# Patient Record
Sex: Male | Born: 1955 | Race: White | Hispanic: No | Smoking: Current every day smoker
Health system: Southern US, Community
[De-identification: ages and names within clinical notes are randomized; demographics above are authoritative.]

## PROBLEM LIST (undated history)

## (undated) DIAGNOSIS — F209 Schizophrenia, unspecified: Secondary | ICD-10-CM

## (undated) DIAGNOSIS — M549 Dorsalgia, unspecified: Secondary | ICD-10-CM

## (undated) DIAGNOSIS — L409 Psoriasis, unspecified: Secondary | ICD-10-CM

## (undated) DIAGNOSIS — C6290 Malignant neoplasm of unspecified testis, unspecified whether descended or undescended: Secondary | ICD-10-CM

## (undated) DIAGNOSIS — F1911 Other psychoactive substance abuse, in remission: Secondary | ICD-10-CM

## (undated) DIAGNOSIS — I10 Essential (primary) hypertension: Secondary | ICD-10-CM

## (undated) HISTORY — DX: Schizophrenia, unspecified: F20.9

## (undated) HISTORY — PX: LUMBAR DISC SURGERY: SHX700

## (undated) HISTORY — DX: Other psychoactive substance abuse, in remission: F19.11

## (undated) HISTORY — DX: Malignant neoplasm of unspecified testis, unspecified whether descended or undescended: C62.90

---

## 2000-03-21 ENCOUNTER — Emergency Department (HOSPITAL_COMMUNITY): Admission: EM | Admit: 2000-03-21 | Discharge: 2000-03-21 | Payer: Self-pay | Admitting: Emergency Medicine

## 2002-08-24 ENCOUNTER — Emergency Department (HOSPITAL_COMMUNITY): Admission: EM | Admit: 2002-08-24 | Discharge: 2002-08-24 | Payer: Self-pay | Admitting: Emergency Medicine

## 2002-08-26 ENCOUNTER — Emergency Department (HOSPITAL_COMMUNITY): Admission: EM | Admit: 2002-08-26 | Discharge: 2002-08-26 | Payer: Self-pay | Admitting: Emergency Medicine

## 2002-08-30 ENCOUNTER — Emergency Department (HOSPITAL_COMMUNITY): Admission: EM | Admit: 2002-08-30 | Discharge: 2002-08-30 | Payer: Self-pay | Admitting: Emergency Medicine

## 2002-10-15 ENCOUNTER — Other Ambulatory Visit (HOSPITAL_COMMUNITY): Admission: RE | Admit: 2002-10-15 | Discharge: 2002-11-11 | Payer: Self-pay | Admitting: Psychiatry

## 2003-09-21 ENCOUNTER — Emergency Department (HOSPITAL_COMMUNITY): Admission: EM | Admit: 2003-09-21 | Discharge: 2003-09-21 | Payer: Self-pay | Admitting: Emergency Medicine

## 2003-12-24 ENCOUNTER — Emergency Department (HOSPITAL_COMMUNITY): Admission: EM | Admit: 2003-12-24 | Discharge: 2003-12-24 | Payer: Self-pay | Admitting: Emergency Medicine

## 2004-12-15 ENCOUNTER — Emergency Department (HOSPITAL_COMMUNITY): Admission: EM | Admit: 2004-12-15 | Discharge: 2004-12-16 | Payer: Self-pay | Admitting: Emergency Medicine

## 2004-12-16 ENCOUNTER — Inpatient Hospital Stay (HOSPITAL_COMMUNITY): Admission: EM | Admit: 2004-12-16 | Discharge: 2004-12-20 | Payer: Self-pay | Admitting: Psychiatry

## 2004-12-16 ENCOUNTER — Ambulatory Visit: Payer: Self-pay | Admitting: Psychiatry

## 2005-01-08 ENCOUNTER — Emergency Department (HOSPITAL_COMMUNITY): Admission: EM | Admit: 2005-01-08 | Discharge: 2005-01-08 | Payer: Self-pay | Admitting: Emergency Medicine

## 2005-01-08 ENCOUNTER — Inpatient Hospital Stay (HOSPITAL_COMMUNITY): Admission: RE | Admit: 2005-01-08 | Discharge: 2005-01-14 | Payer: Self-pay | Admitting: Psychiatry

## 2005-02-18 ENCOUNTER — Ambulatory Visit (HOSPITAL_COMMUNITY): Payer: Self-pay | Admitting: Psychiatry

## 2005-02-18 ENCOUNTER — Ambulatory Visit: Payer: Self-pay | Admitting: Psychiatry

## 2005-02-18 ENCOUNTER — Inpatient Hospital Stay (HOSPITAL_COMMUNITY): Admission: RE | Admit: 2005-02-18 | Discharge: 2005-02-24 | Payer: Self-pay | Admitting: Psychiatry

## 2005-02-25 ENCOUNTER — Ambulatory Visit (HOSPITAL_COMMUNITY): Payer: Self-pay | Admitting: Psychiatry

## 2005-03-13 ENCOUNTER — Ambulatory Visit (HOSPITAL_COMMUNITY): Payer: Self-pay | Admitting: Physician Assistant

## 2005-03-13 ENCOUNTER — Ambulatory Visit: Payer: Self-pay | Admitting: Physician Assistant

## 2005-04-22 ENCOUNTER — Ambulatory Visit (HOSPITAL_COMMUNITY): Payer: Self-pay | Admitting: Psychiatry

## 2005-05-20 ENCOUNTER — Ambulatory Visit (HOSPITAL_COMMUNITY): Payer: Self-pay | Admitting: Psychiatry

## 2005-06-19 ENCOUNTER — Ambulatory Visit (HOSPITAL_COMMUNITY): Payer: Self-pay | Admitting: Psychiatry

## 2005-07-22 ENCOUNTER — Ambulatory Visit (HOSPITAL_COMMUNITY): Payer: Self-pay | Admitting: Psychiatry

## 2005-09-26 ENCOUNTER — Ambulatory Visit (HOSPITAL_COMMUNITY): Payer: Self-pay | Admitting: Physician Assistant

## 2005-11-27 ENCOUNTER — Ambulatory Visit (HOSPITAL_COMMUNITY): Payer: Self-pay | Admitting: Psychiatry

## 2006-02-21 ENCOUNTER — Ambulatory Visit (HOSPITAL_COMMUNITY): Payer: Self-pay | Admitting: Psychiatry

## 2006-03-03 ENCOUNTER — Inpatient Hospital Stay (HOSPITAL_COMMUNITY): Admission: RE | Admit: 2006-03-03 | Discharge: 2006-03-07 | Payer: Self-pay | Admitting: *Deleted

## 2006-03-04 ENCOUNTER — Ambulatory Visit: Payer: Self-pay | Admitting: *Deleted

## 2006-05-28 ENCOUNTER — Ambulatory Visit (HOSPITAL_COMMUNITY): Payer: Self-pay | Admitting: Psychiatry

## 2006-10-27 ENCOUNTER — Inpatient Hospital Stay (HOSPITAL_COMMUNITY): Admission: EM | Admit: 2006-10-27 | Discharge: 2006-10-31 | Payer: Self-pay | Admitting: *Deleted

## 2006-10-27 ENCOUNTER — Ambulatory Visit: Payer: Self-pay | Admitting: *Deleted

## 2006-11-19 ENCOUNTER — Ambulatory Visit (HOSPITAL_COMMUNITY): Payer: Self-pay | Admitting: Psychiatry

## 2006-12-09 ENCOUNTER — Inpatient Hospital Stay (HOSPITAL_COMMUNITY): Admission: RE | Admit: 2006-12-09 | Discharge: 2006-12-15 | Payer: Self-pay | Admitting: *Deleted

## 2006-12-09 ENCOUNTER — Ambulatory Visit: Payer: Self-pay | Admitting: *Deleted

## 2007-01-03 ENCOUNTER — Inpatient Hospital Stay (HOSPITAL_COMMUNITY): Admission: AD | Admit: 2007-01-03 | Discharge: 2007-01-09 | Payer: Self-pay | Admitting: *Deleted

## 2007-01-03 ENCOUNTER — Ambulatory Visit: Payer: Self-pay | Admitting: *Deleted

## 2007-01-22 ENCOUNTER — Ambulatory Visit (HOSPITAL_COMMUNITY): Payer: Self-pay | Admitting: Psychiatry

## 2007-01-23 ENCOUNTER — Inpatient Hospital Stay (HOSPITAL_COMMUNITY): Admission: AD | Admit: 2007-01-23 | Discharge: 2007-01-28 | Payer: Self-pay | Admitting: *Deleted

## 2007-07-25 ENCOUNTER — Inpatient Hospital Stay (HOSPITAL_COMMUNITY): Admission: EM | Admit: 2007-07-25 | Discharge: 2007-07-27 | Payer: Self-pay | Admitting: Psychiatry

## 2007-07-27 ENCOUNTER — Ambulatory Visit: Payer: Self-pay | Admitting: Psychiatry

## 2007-09-04 ENCOUNTER — Inpatient Hospital Stay (HOSPITAL_COMMUNITY): Admission: AD | Admit: 2007-09-04 | Discharge: 2007-09-07 | Payer: Self-pay | Admitting: Psychiatry

## 2007-11-05 ENCOUNTER — Ambulatory Visit: Payer: Self-pay | Admitting: Psychiatry

## 2007-11-05 ENCOUNTER — Inpatient Hospital Stay (HOSPITAL_COMMUNITY): Admission: RE | Admit: 2007-11-05 | Discharge: 2007-11-12 | Payer: Self-pay | Admitting: Psychiatry

## 2007-12-04 ENCOUNTER — Ambulatory Visit (HOSPITAL_COMMUNITY): Payer: Self-pay | Admitting: Psychiatry

## 2008-03-30 ENCOUNTER — Emergency Department (HOSPITAL_COMMUNITY): Admission: EM | Admit: 2008-03-30 | Discharge: 2008-03-30 | Payer: Self-pay | Admitting: Emergency Medicine

## 2008-04-11 ENCOUNTER — Ambulatory Visit: Payer: Self-pay | Admitting: *Deleted

## 2008-04-11 ENCOUNTER — Encounter (INDEPENDENT_AMBULATORY_CARE_PROVIDER_SITE_OTHER): Payer: Self-pay | Admitting: Emergency Medicine

## 2008-04-11 ENCOUNTER — Ambulatory Visit: Payer: Self-pay | Admitting: Family Medicine

## 2008-04-11 ENCOUNTER — Inpatient Hospital Stay (HOSPITAL_COMMUNITY): Admission: EM | Admit: 2008-04-11 | Discharge: 2008-04-13 | Payer: Self-pay | Admitting: Emergency Medicine

## 2008-04-18 ENCOUNTER — Encounter (INDEPENDENT_AMBULATORY_CARE_PROVIDER_SITE_OTHER): Payer: Self-pay | Admitting: *Deleted

## 2008-05-23 ENCOUNTER — Emergency Department (HOSPITAL_COMMUNITY): Admission: EM | Admit: 2008-05-23 | Discharge: 2008-05-23 | Payer: Self-pay | Admitting: Emergency Medicine

## 2008-05-23 ENCOUNTER — Ambulatory Visit: Payer: Self-pay | Admitting: Psychiatry

## 2008-05-23 ENCOUNTER — Inpatient Hospital Stay (HOSPITAL_COMMUNITY): Admission: AD | Admit: 2008-05-23 | Discharge: 2008-06-08 | Payer: Self-pay | Admitting: Psychiatry

## 2008-08-14 ENCOUNTER — Emergency Department (HOSPITAL_COMMUNITY): Admission: EM | Admit: 2008-08-14 | Discharge: 2008-08-14 | Payer: Self-pay | Admitting: Emergency Medicine

## 2008-09-04 ENCOUNTER — Emergency Department (HOSPITAL_COMMUNITY): Admission: EM | Admit: 2008-09-04 | Discharge: 2008-09-05 | Payer: Self-pay | Admitting: Emergency Medicine

## 2008-09-05 ENCOUNTER — Inpatient Hospital Stay (HOSPITAL_COMMUNITY): Admission: AD | Admit: 2008-09-05 | Discharge: 2008-09-15 | Payer: Self-pay | Admitting: Psychiatry

## 2008-09-05 ENCOUNTER — Ambulatory Visit: Payer: Self-pay | Admitting: Psychiatry

## 2008-09-05 ENCOUNTER — Emergency Department (HOSPITAL_COMMUNITY): Admission: EM | Admit: 2008-09-05 | Discharge: 2008-09-05 | Payer: Self-pay | Admitting: Emergency Medicine

## 2008-09-14 ENCOUNTER — Ambulatory Visit (HOSPITAL_COMMUNITY): Admission: RE | Admit: 2008-09-14 | Discharge: 2008-09-14 | Payer: Self-pay | Admitting: Psychiatry

## 2008-11-23 ENCOUNTER — Ambulatory Visit (HOSPITAL_COMMUNITY): Payer: Self-pay | Admitting: Psychiatry

## 2009-01-03 ENCOUNTER — Ambulatory Visit (HOSPITAL_COMMUNITY): Payer: Self-pay | Admitting: Psychiatry

## 2009-02-21 ENCOUNTER — Ambulatory Visit (HOSPITAL_COMMUNITY): Payer: Self-pay | Admitting: Psychiatry

## 2009-04-21 ENCOUNTER — Ambulatory Visit (HOSPITAL_COMMUNITY): Payer: Self-pay | Admitting: Psychiatry

## 2009-07-25 ENCOUNTER — Ambulatory Visit (HOSPITAL_COMMUNITY): Payer: Self-pay | Admitting: Psychiatry

## 2009-10-10 ENCOUNTER — Ambulatory Visit (HOSPITAL_COMMUNITY): Payer: Self-pay | Admitting: Psychiatry

## 2010-05-08 ENCOUNTER — Ambulatory Visit (HOSPITAL_COMMUNITY): Payer: Self-pay | Admitting: Psychiatry

## 2010-07-03 ENCOUNTER — Ambulatory Visit (HOSPITAL_COMMUNITY): Payer: Self-pay | Admitting: Psychiatry

## 2010-09-12 ENCOUNTER — Ambulatory Visit (HOSPITAL_COMMUNITY)
Admission: RE | Admit: 2010-09-12 | Discharge: 2010-09-12 | Payer: Self-pay | Source: Home / Self Care | Attending: Psychiatry | Admitting: Psychiatry

## 2010-12-12 ENCOUNTER — Encounter (HOSPITAL_COMMUNITY): Payer: Self-pay | Admitting: Physician Assistant

## 2010-12-28 ENCOUNTER — Encounter (HOSPITAL_COMMUNITY): Payer: Self-pay | Admitting: Physician Assistant

## 2011-01-22 NOTE — H&P (Signed)
[name], [name] NO.:  [id number]   MEDICAL RECORD NO.:  [id number]          PATIENT TYPE:  IPS   LOCATION:  0401                          FACILITY:  [facility]   PHYSICIAN:  Anselm Jungling, MD  DATE OF BIRTH:  1956-08-10   DATE OF ADMISSION:  05/23/2008  DATE OF DISCHARGE:                       PSYCHIATRIC ADMISSION ASSESSMENT   TIME:  8:45 a.m.   IDENTIFYING INFORMATION:  A 56 year old Caucasian male who is divorced.  This is a voluntary admission.   HISTORY OF PRESENT ILLNESS:  This is one of several [facility] admissions for  this 55 year old who is well known to [place].  He was picked up by police at  [employer] where he was acting confused.  He was going in and out of  the exit doors setting off the alarms.  Appeared to be confused when  questioned by the staff.  Today he has been cooperative and directable,  but is oriented to his name only.  Appears to be severely thought  disordered, staring, having difficulty even with the simple motor  planning.  When asked to take his shoes off, simply sits down and stares  at his feet.  Does respond to physical prompting.  Urine drug screen was  negative.  He did report drinking a bottle of alcohol last week.  He is  unable to give any significant history.   PAST PSYCHIATRIC HISTORY:  Previously followed by Jorje Guild, PA at the  [facility] and also previously followed  by Dr. Meredith Staggers.  No current outpatient care is known.  Last here at  [facility] in May 2006.  Originally diagnosed with  schizophrenia in [place] approximately 12 years ago.  He also has a  history of heavy cocaine use, last use not known and periods of  abstinence are not clear.  Also history of IV cocaine abuse.  He has a  history of suicide attempts including attempts to suffocate himself.   SOCIAL HISTORY:  Divorced gentleman and on disability for his mental  illness.  He previously has a law degree.   Currently living in a motel  here in [place].  Originally from [place] and moved here several  years ago to follow his children who he has not been able to see  consistently.  He has 2 grown children whose ages are approximately a 65-  year-old daughter and a 63 year old son.  He has no current legal  charges.   FAMILY HISTORY:  Two brothers with history of polysubstance abuse.  One  brother diagnosed with bipolar disorder and completed suicide.   MEDICAL HISTORY:  Primary care practitioner not known.  Current medical  problems:  Blisters and erythema of bilateral feet.  Weight loss, NOS.   PAST MEDICAL HISTORY:  1. Diabetes.  2. Heart murmur.  3. Testicular cancer with surgery in 1993.  4. IV cocaine use.  5. Lumbar diskectomy.  6. Recent episode of cellulitis in his lower extremities.   CURRENT MEDICATIONS:  None known.  Previously on Thorazine 50 mg b.i.d.   DRUG ALLERGIES:  NO KNOWN DRUG  ALLERGIES.   PHYSICAL EXAMINATION:  Physical exam was done in the emergency room.  We  do note today that he has reported he has been doing an excessive amount  of walking.  He has erythematous blisters on both feet.  Weight is 68 kg  today with his last weight of 86 kg November 2008.   LABORATORY DATA:  CBC:  WBC 7.0, hemoglobin 12.3, hematocrit 36.1,  platelets 163,000.  Chemistry: Sodium 139, potassium 3.5, chloride 107,  carbon dioxide 25, BUN 9, creatinine 0.66 and random glucose 96.  Liver  enzymes:  SGOT 41, SGPT 28, alkaline phosphatase 99, total bilirubin  1.3, albumin 3.6 and calcium 9.0.  Urine drug screen is negative for all  substances.   MENTAL STATUS EXAM:  Fully alert, severely thought disordered.  Responds  to his name, glances around.  Will offer 1-2 word answers, then derails  on the answer to every question.  Mood neutral.  He is up and about,  eating and drinking, taking fluids.  Appears internally distracted.  Motor is intact.  Sensory is intact.  Cerebellar  function intact.  Romberg without findings.   AXIS I:  Psychosis, not otherwise specified, rule out substance-induced  delirium, history of schizophrenia.  AXIS II:  Deferred.  AXIS III:  Red and blistered feet.  AXIS IV:  Deferred.  AXIS V:  Current 26, past year not known.   PLAN:  Voluntarily admit him to improve his reality testing.  We are  going to aggressively hydrate him.  Offer him Ensure q.i.d.  We started  him on a Librium protocol and he will receive 25 mg now.  We will resume  his Thorazine 50 mg q.a.m. and h.s.  We will ask our social worker to  explore his home situation as soon as he is able to participate a little  bit better.  We will keep a close eye on him with vital signs b.i.d.  He  has recently had an episode of cellulitis in his lower extremities and  now has red blisters.  We will start him on doxycycline 100 mg b.i.d.  for 14 days and we will monitor his feet daily.      Margaret A. Lorin Picket, N.P.      Anselm Jungling, MD  Electronically Signed    MAS/MEDQ  D:  05/24/2008  T:  05/25/2008  Job:  [id number]

## 2011-01-22 NOTE — Discharge Summary (Signed)
[name], CURL NO.:  [id number]   MEDICAL RECORD NO.:  [id number]          PATIENT TYPE:  INP   LOCATION:  3013                         FACILITY:  [facility]   PHYSICIAN:  Leighton Roach McDiarmid, M.D.DATE OF BIRTH:  04/29/1956   DATE OF ADMISSION:  04/11/2008  DATE OF DISCHARGE:  04/13/2008                               DISCHARGE SUMMARY   PRIMARY CARE Jacque Garrels:  Unassigned at the time of admission.   DISCHARGE DIAGNOSES:  1. Cellulitis.  2. Schizophrenia.  3. History of drug abuse.   DISCHARGE MEDICATION:  1. Thorazine 50 mg nightly.  2. Haldol 15 mg q.a.m.  3. Doxycycline 100 mg b.i.d. x14 days.   CONSULT:  Wound care.   PROCEDURE:  Radiograph of leg showed no cellulitis.   LABORATORY DATA:  White blood cell count 7.2 and ESR 15.  CRP 3.4.  HIV  test nonreactive.  Gram stain of wound showed gram-positive cocci in  clusters.   BRIEF HOSPITAL COURSE:  This is a 55 year old male with a past medical  history significant for drug abuse and schizophrenia with cellulitis.  1. Skin lesions.  The patient was started on vancomycin at pharmacy      dose upon admission.  Lesions appeared better on exam on the      following day, but some lab results were not consisted with soft      tissue infection with ESR and white blood cell count being normal.      CRP was found to be elevated and Gram stain showed Gram-positive      cocci clusters.  Gram stain results, however, could be due to      contamination.  The patient put on his IV and doxycycline 100 mg      p.o. b.i.d. x14 days was started at that time to cover MRSA.  Wound      care was consulted and adjusted bacitracin ointment application.      The patient was given an appointment at [facility] with      Dr. Sylvan Cheese to follow in 1 week.  2. Schizophrenia.  The patient was unaware of complete antipsychotic      regimen.  He became agitated with paranoid delusions.  The      following day, the  patient's regimen was changed to Thorazine 50 mg      at night and Haldol 15 mg in the morning based upon Dr. Kirt Boys      records.  3. Drug abuse, was not able to obtain urinary drug screen during his      stay.  HIV test was negative.  4. Hyponatremia.  He was slightly hyponatremic on admission, was      improved on BMET following day and within normal limits.   DISCHARGE INSTRUCTIONS:  Apply bacitracin ointment as directed by Wound  Care.  If the patient develops a fever or wounds unlike become worse, he  should call the [facility] Office to talk with a doctor.  The  patient should avoid drug abuse and should be adherent with medication  regimen.  FOLLOWUP APPOINTMENT:  Dr. Sylvan Cheese on April 22, 2008   DISCHARGE CONDITION:  The patient was discharged home in stable medical  condition.      Sylvan Cheese, M.D.  Electronically Signed      Leighton Roach McDiarmid, M.D.  Electronically Signed    MJ/MEDQ  D:  04/13/2008  T:  04/13/2008  Job:  [id number]   cc:   Yolande Jolly, PA

## 2011-01-22 NOTE — H&P (Signed)
NAME:  [name], [name] NO.:  [id number]   MEDICAL RECORD NO.:  [id number]           PATIENT TYPE:   LOCATION:                                 FACILITY:   PHYSICIAN:  Anselm Jungling, MD  DATE OF BIRTH:  November 23, 1955   DATE OF ADMISSION:  11/04/2005  DATE OF DISCHARGE:                       PSYCHIATRIC ADMISSION ASSESSMENT   DATE OF THE ASSESSMENT:  November 06, 2007 at 1:15 p.m.   IDENTIFYING INFORMATION:  A 55 year old white male, divorced.  This is a  voluntary admission.   HISTORY OF PRESENT ILLNESS:  This patient presented himself at mental  health complaining of having auditory hallucinations, that were getting  worse, felt that he could no longer be safe, reported that he had been  using cocaine for the previous 48 hours.  Also said that he had stopped  his medications for about the previous month.  He had walked away from  his apartment and left everything there.  Apparently he owed some money,  cannot go back there, is currently homeless.  He reports that he has  been gradually hearing whispers.  He voices feeling that people were  trying to a break into his apartment. felt that it was not safe to be  there.  Also recognizes that he is becoming more and more suspicious.  Feeling that certain people in the [facility] are having him  watched on a constant basis.  He was having thoughts of possibly  overdosing on something, feeling that he could not be safe at home.   PAST PSYCHIATRIC HISTORY:  This is one of several admissions for this  gentleman who is well-known to [place], history of schizophrenia, paranoid  type. Last admitted here December 26-29, 2008.  He has a history of a  cocaine abuse and schizophrenia.  He has been previously hospitalized  also at [facility] in  [place], two hospitals in [place], and [facility]  and [facility] in the [place].  He has also  been seen  locally here at [facility] and the [facility].   SOCIAL HISTORY:  The patient is divorced, has been living alone in  [place] for quite some time.  He has two children, a daughter and a  son, approximately 51 and 54 years old.  He is on Social Security  disability for chronic mental illness.  He has worked part-time in the  past as a day labor, and at one point had attended vocational rehab.  He  admits to having poor social supports.  He has one brother in [place]  whom he communicates with intermittently.  He denies any legal problems.  Has one brother with schizophrenia, another brother who committed  suicide.  He denies a family history of substance abuse.   ALCOHOL AND DRUG HISTORY:  Used alcohol rarely, but denies that he is  ever been addicted to it, nor abuse it in any amount.  Started using  cocaine at age 85, and has used it chronically and intermittently over  the course of the  past year.  Longest abstinence is not clear.   MEDICAL HISTORY:  Primary care physician has been Dr. Alwyn Ren in the  past, or a Dr. Yetta Barre [facility].  Medical  problems are none in the past.  He does have a history of testicular  cancer and occasionally problems with low back pain.  He needs  corrective lenses for reading and has dentures.   CURRENT MEDICATIONS:  1. Haldol 5 mg b.i.d.  2. Thorazine 25 mg daily.  3. Previously also has been treated with amantadine 100 mg b.i.d. for      his cocaine addiction.   DRUG ALLERGIES:  None.   REVIEW OF SYSTEMS:  Remarkable for sleeping decreased about 2-4 hours  per night for the past 3 days, prior to that unknown.  He denies any  weight loss.  Eating pattern has been irregular when he is abusing  cocaine not taking regular meals.  Denies any abdominal pain.  Denies  chest pain fullness in his chest or shortness of breath.  RESPIRATORY:  No cough.  No wheeze.  GENITOURINARY:  He is denying  any a risk of  sexually transmitted disease.  Denies any dysuria or pain.  Nocturia x1.   PHYSICAL EXAM:  Noted in the record and the patient did generally  appears to be at baseline.  This is a tall thin white male who is in no  acute distress.  Has been refusing his vital signs.  Other than that he  allows me to listen to his chest today.  The rest of the physical exam  is by gross inspection.  Vital signs on admission temperature 97.6,  pulse 91, respirations 16, blood pressure 104/63.  He has been attending  all meals.  The physical exam as noted in the record and is  unremarkable.   DIAGNOSTIC STUDIES:  CBC WBC 7.1, hemoglobin 13.6, hematocrit 38.3 and  platelets 197,000.  Chemistry sodium 140, potassium 3.7, chloride 108,  carbon dioxide 25, BUN 4, creatinine 0.73 and random glucose is 92.  Liver enzymes SGOT 19, SGPT 27, alkaline phosphatase 123, and total  bilirubin 0.9, TSH is 0.548. Calcium normal at 9.4.  Urinalysis and  urine drug screen is currently pending.   MENTAL STATUS EXAM:  Fully alert male, cooperative, wrapped up in the  covers.  He is fairly articulate and appears at baseline.  Admits that  he has been using cocaine.  He let his medications lapse for reasons  that are unclear.  Speech is nonpressured, relatively articulate.  He is  in full contact with reality and oriented x4.  Mood is neutral.  Thought  process is logical and coherent.  Some vague soft auditory  hallucinations, today, but no active suicidal thoughts.  No homicidal  thoughts.  Cognition is intact.   IMPRESSION:  AXIS I:  1. Schizophrenia, paranoid type.  2. Cocaine abuse, rule out dependence.  AXIS II:  Deferred.  AXIS III:  No diagnosis.  AXIS IV:  Severe issues with homelessness.  AXIS V:  Current 45 past year 28.   PLAN:  To voluntarily admit the patient with every 15-minute checks in  place with a goal of alleviating his psychosis.  We have resumed his  Haldol 5 mg p.o. b.i.d. and  Thorazine 25 mg daily.  He has been eating  meals today, interacting appropriately with staff and cooperative.  He  reports he will not have a disability check until March 12, and is not  sure how he is going to survive until then.  We plan to coordinate with  his primary psychiatrist, and patient is in agreement with the plan.     Margaret A. Lorin Picket, N.P.      Anselm Jungling, MD  Electronically Signed   MAS/MEDQ  D:  11/06/2007  T:  11/07/2007  Job:  [id number]

## 2011-01-22 NOTE — Discharge Summary (Signed)
NAME:  [name] NO.:  [id number]   MEDICAL RECORD NO.:  [id number]          PATIENT TYPE:  IPS   LOCATION:  0307                          FACILITY:  [facility]   PHYSICIAN:  Anselm Jungling, MD  DATE OF BIRTH:  06/22/1956   DATE OF ADMISSION:  09/05/2008  DATE OF DISCHARGE:  09/14/2008                               DISCHARGE SUMMARY   IDENTIFYING DATA AND REASON FOR ADMISSION:  This was one of many  inpatient psychiatric admissions for [name], a 55 year old single  Caucasian male who was admitted due to exacerbation of psychotic  symptoms.  Please refer to the admission note for further details  pertaining to the symptoms, circumstances and history that led to his  hospitalization.  He was given an initial Axis I diagnosis of psychosis  NOS.   MEDICAL AND LABORATORY:  The patient was in good health without any  active or chronic medical problems, although he was underweight, and  appeared to be undernourished.  He was medically and physically assessed  by the psychiatric nurse practitioner.  There were no significant  medical issues.   HOSPITAL COURSE:  The patient was admitted to the adult inpatient  psychiatric service.  He presented as a slender, tall, but normally  developed and adequately nourished adult male with mildly disorganized  thinking.  He was pleasant, however, and appropriate.  His mood was  depressed, but he denied suicidal ideation and verbalized a strong  desire for help.  He was cooperative, and recognized the need for  restarting of his antipsychotic medication.   The patient was restarted on his usual regimen of Haldol and Thorazine.  In addition, Cogentin was utilized for side effects, Benadryl at bedtime  for sleep, and a retrial of Wellbutrin XL 150 mg daily to address  depressive symptoms, at the patient's request.   The patient had not been abusing substances for several weeks prior to  this admission.  This was positive change  from his many previous  admissions, which had been in part, based upon severe substance abuse.  Also, in previous admissions, he had not been very open to various forms  of aftercare and help, and housing arrangements that had been offered to  him.  In this particular case, he appeared to be strongly committed to  staying off alcohol and drugs, and was much more interested in  developing a solid aftercare and recovery plan, including one for  housing.  He worked closely with case manager towards this end.   Fortunately, his funding situation allowed him to be selective about a  desirable assisted-living facility, ultimately which she was able to  select and arrange for.   His symptoms improved relatively rapidly, and by the fourth hospital  day, his thoughts were clear, with good abstraction, good reality  testing, good insight and judgment.  He was tolerating his medications  without any marked side effects.  It should be noted however that he was  noted at the time of admission to have mild rhythmic jaw movements,  suggestive of tardive dyskinesia.  He was quite aware of these,  but also  recognized the need for ongoing medication management to prevent further  deterioration due to his primary underlying illness.   AFTERCARE:  The patient was to follow-up at [facility] with an intake appointment with Dr. Mikey Bussing on September 20, 2008  at 1:30 p.m.   DISCHARGE MEDICATIONS:  Haldol 5 mg q.h.s., Thorazine 50 mg b.i.d.,  Wellbutrin XL 150 mg daily, Benadryl 50 mg q.h.s., Cogentin 1 mg b.i.d.   DISCHARGE DIAGNOSES:  AXIS I: Schizophrenia, chronic undifferentiated  type, and history of polysubstance abuse, in remission.  AXIS II: Deferred.  AXIS III: No acute or chronic illnesses.  AXIS IV: Stressors severe.  AXIS V: GAF on discharge 55.      Anselm Jungling, MD  Electronically Signed     SPB/MEDQ  D:  09/16/2008  T:  09/16/2008  Job:  [id number]

## 2011-01-22 NOTE — Discharge Summary (Signed)
NAME:  [name], [name] NO.:  [id number]   MEDICAL RECORD NO.:  [id number]          PATIENT TYPE:  IPS   LOCATION:  0405                          FACILITY:  [facility]   PHYSICIAN:  Jasmine Pang, M.D. DATE OF BIRTH:  1956/01/20   DATE OF ADMISSION:  01/03/2007  DATE OF DISCHARGE:  01/09/2007                               DISCHARGE SUMMARY   IDENTIFICATION:  This is a 55 year old divorced white male who was  admitted on a voluntary basis on January 03, 2007.   HISTORY OF PRESENT ILLNESS:  The patient presented as a walk-in to our  hospital.  He reported feeling worse than when he was here December 09, 2006  to December 15, 2006.  He was still using cocaine.  He states he felt like  people on TV were controlling his mind.  He had intermittent suicidal  ideation.  He felt agitated and panicky.  He did not feel medications  were helping him.  He reports that his schizophrenia has become  increasingly difficult to manage over the time.  He has no local social  supports and had thought about relocating to [place] to be nearer to  his mother.  However, he states she told him not to do this and he felt  he had nowhere to go.  He was most recently with [place] December 09, 2006 to  December 15, 2006 and, at that time, he was also using cocaine and reporting  persecutory voices telling him he was worthless and commanding him to  kill himself.  He has had numerous admissions at [facility]  here.  He was originally diagnosed 10 years ago in [place] at the  [facility].  He has been followed as an outpatient for the  past two years by Dr. Lolly Mustache and Jorje Guild, PA.  He has a brother who  committed suicide who was bipolar (this was approximately a year ago).  He states he also has another brother who is schizophrenic.  He has no  known medical problems.  He is currently on Thorazine 100 mg p.o.  b.i.d., Sinequan 50 mg p.o. q.h.s., Haldol 5 mg in the morning and 10 mg  at  h.s., Ambien 10 mg at h.s. p.r.n. insomnia, Cogentin 2 mg q.8h.  p.r.n. muscle tightness.  He has no known drug allergies.   PHYSICAL EXAMINATION:  The patient is a thin white male who appears  older than his stated age due to being edentulous.  He does not have his  dentures in at the moment.  He does have some scars on his arms from  shooting cocaine.  He has only one testicle due to testicular cancer and  removal of one of the testicles in 1993.   LABORATORY DATA:  BMET was grossly within normal limits except for an  elevated glucose of 103 (70-99) and a decreased BUN of 3 (6-23).  Calcium was 8.6 which was within normal limits.  Other labs had been  done on his recent previous admission and were not repeated.   HOSPITAL COURSE:  Upon admission,  the patient was continued on Thorazine  100 mg p.o. b.i.d. and Sinequan 50 mg p.o. q.h.s., Haldol 5 mg p.o.  q.a.m. and 10 mg q.h.s., Ambien 10 mg p.o. q.h.s.  He was started on  Protonix 40 mg p.o. b.i.d., Vistaril 50 mg IM dose now then q.4-6h.  p.r.n. agitation, Thorazine 100 mg now as well.  On January 04, 2007, the  patient was started on Vistaril 50 mg q.4h. p.r.n. anxiety and Seroquel  50 mg p.o. q.4h. p.r.n. anxiety.  On January 07, 2007, the patient was  started on Cogentin 1 mg p.o. b.i.d.  The patient tolerated these  medications well with no significant side effects.   Upon admission, the patient had positive suicidal ideation but  contracted for safety here.  He had all positive auditory  hallucinations.  He states he always hears voices.  He stated my  schizophrenia is the problem.  He was reluctant to talk during the  initial part of the hospitalization and at times would not get out of  bed to speak with me in the office.  As hospitalization progressed, his  sleep and appetite were good.  He discussed his plans for follow-up when  he leaves here.  He has not been compliant in the past with post  hospital plans.  There was a  possibility of some akathisia.  Cogentin 1  mg now, then 1 mg p.o. b.i.d. was ordered.  The patient discussed the  possibility of having an ACT team to help.  This would include a  psychiatrist and a payee.  He contemplated whether he wanted to do this.   On Jan 09, 2007, mental status had improved markedly from admission  status.  The patient was friendly and cooperative with good eye contact.  Speech was normal rate and flow.  Psychomotor activity was within normal  limits.  Mood was euthymic.  Affect wide range.  There were no suicidal  or homicidal ideation.  No thoughts of self-injurious behavior.  No  auditory or visual hallucinations.  No paranoia or delusions.  Thoughts  were logical and goal-directed.  Thought content no predominant theme.  The cognitive was grossly within normal limits and back to baseline.   DISCHARGE DIAGNOSES:  AXIS I:  Schizophrenia, paranoid-type.  Cocaine  abuse.  AXIS II:  None.  AXIS III:  Status post testicular cancer in 1993 and partial lumbar  diskectomy in the past.  AXIS IV:  Severe (chronic medical illness, mental illness, substance  abuse, suicide of his brother, mental illness of another brother).  AXIS V:  GAF upon discharge 50; GAF upon admission 20; GAF highest past  year 65.   ACTIVITY/DIET:  There were no specific activity level or dietary  restrictions.   POST-HOSPITAL CARE PLANS:  The patient will be followed by an ACT team  who he will meet Monday, Jan 12, 2007.  He will have Family Services  counselor on Friday, Jan 23, 2007 at 11 a.m.   DISCHARGE MEDICATIONS:  1. Protonix 40 mg twice daily.  2. Thorazine 100 mg twice daily.  3. Sinequan 50 mg p.o. q.h.s.  4. Haldol 5 mg in the morning and 10 mg at bedtime.  5. Ambien 10 mg at bedtime.  6. Cogentin 1 mg twice daily.      Jasmine Pang, M.D.  Electronically Signed    BHS/MEDQ  D:  01/19/2007  T:  01/20/2007  Job:  [id number]

## 2011-01-22 NOTE — H&P (Signed)
NAME:  [name], [name] NO.:  [id number]   MEDICAL RECORD NO.:  [id number]          PATIENT TYPE:  IPS   LOCATION:  0404                          FACILITY:  [facility]   PHYSICIAN:  Vic Ripper, P.A.-C.DATE OF BIRTH:  Jan 18, 1956   DATE OF ADMISSION:  09/04/2007  DATE OF DISCHARGE:                       PSYCHIATRIC ADMISSION ASSESSMENT   IDENTIFYING INFORMATION/JUSTIFICATION FOR ADMISSION AND CARE:  This is a  55 year old, divorced, white male.  He presented last night and reported  that he was having auditory as well as visual hallucinations.  This was  due to using crack cocaine and not being compliant with his prescribed  medications for at least 10 days.  [name] is well known to this  facility.  He states he has been off his medications for at least 10  days.  He did not feel safe at home.  He was reporting that he was  seeing shadows of people in his apartment and hearing people talking,  commenting on his thoughts, but they were non-directive.  He did not  feel safe.  He was constantly checking around the apartment to see if  people were there.  He was paranoid and reported that he felt  persecuted.  He states that he has been using 4 grams of crack cocaine  today, is currently having withdrawal, and his last reported use was  yesterday morning.   PAST PSYCHIATRIC HISTORY:  This is one of numerous admissions for Mr.  Gallegos and this is his regular pattern.   SOCIAL HISTORY:  He became a Clinical research associate in 1984.  He has been married and  divorced once.  He has a 71 year old son, a 75 year old daughter.  He  states he has not had employment in 15 years.  He currently lives alone.   FAMILY HISTORY:  He states he has 2 brothers who are also schizophrenic.  He smokes heavily, 3 packs of cigarettes per day, and he is also using a  large amount of crack, 4 grams a day.   PRIMARY CARE PHYSICIAN:  Primary care [name], he denies having one.  He has recently transferred  care to [facility] with Dr. Donell Beers.   MEDICAL PROBLEMS:  None are known.   MEDICATIONS:  He is prescribed Haldol 5 mg twice daily, Thorazine 25 mg  a day and he has been non-compliant for at least 10 days.  He states  that he uses all his money for crack and has no money to buy his  medications.   DRUG ALLERGIES:  No known drug allergies.   POSITIVE PHYSICAL FINDINGS:  His vital signs on admission show that  there were no abnormals reported.  He denies any issues with his medical  health other than coming off of the crack and despite having had a  shower, he still has severe body odor.   MENTAL STATUS EXAM:  He is alert.  He is now appropriately groomed,  dressed.  He appears to be adequately nourished, although he is thin,  but this is his normal body habitus.  His mood is depressed.  His affect  is congruent.  His thought  processes are clear, rational and goal-  oriented.  He knows he needs to stop abusing the crack.  Judgment and  insight are fair.  Concentration and memory are good.  Intelligence is  at least average.  He denies being suicidal or homicidal.  He does have  auditory hallucinations.   DIAGNOSIS:  Axis I:  Cocaine abuse/dependence, schizophrenia, paranoid  type with auditory/visual hallucinations due to non-compliance with  medications and using crack cocaine.  Axis II:  Deferred.  Axis III:  None known.  Axis IV:  Promises primary support group and economic issues.  Axis V:  25   PLAN:  The plan is to admit for safety and stabilization to help support  him through his withdrawal from crack.  We will restart his medication  and we will once again consider an assisted living placement to help him  maintain his sobriety once he becomes clean and sober again.      Vic Ripper, P.A.-C.     MD/MEDQ  D:  09/05/2007  T:  09/06/2007  Job:  [id number]

## 2011-01-25 NOTE — Discharge Summary (Signed)
NAME:  LESHON, ARMISTEAD NO.:  [id number]   MEDICAL RECORD NO.:  [id number]          PATIENT TYPE:  IPS   LOCATION:  0403                          FACILITY:  [facility]   PHYSICIAN:  Anselm Jungling, MD  DATE OF BIRTH:  Sep 29, 1955   DATE OF ADMISSION:  05/23/2008  DATE OF DISCHARGE:  06/08/2008                               DISCHARGE SUMMARY   IDENTIFYING DATA AND REASON FOR ADMISSION:  This is one of many [facility]  admissions for [name], a 55 year old single Caucasian male with a long  history of paranoid schizophrenia, as well as polysubstance abuse.  He  was admitted due to exacerbation of psychosis, and also in need of detox  because of heavy alcohol consumption.  Please refer to the admission  note for further details pertaining to the symptoms, circumstances and  history that led to his hospitalization.  He was given an initial Axis I  diagnoses of psychosis, NOS, polysubstance induced psychosis, and  polysubstance abuse/dependence.   MEDICAL AND LABORATORY:  The patient was medically and physically  assessed by the psychiatric nurse practitioner.  Although thin, and  having lost a lot of weight recently, he was in generally good health  without active or chronic medical problems.   HOSPITAL COURSE:  The patient was admitted to the adult inpatient  psychiatric service.  He presented as a thin, normally-developed male of  above-average intelligence.  He was pleasant and cooperative, but showed  significant thought blocking and slowed mentation.  With great effort,  he was able to relate fairly normally, although with very blunted  affect.  He appeared to be in early alcohol withdrawal and he was placed  on a Librium withdrawal protocol.  He was restarted on psychotropic  medications, including Thorazine and Haldol, which had been used  successfully to stabilize him previously.   He had lesions on his feet that apparently were the results of excessive  long distance  walking without adequate foot protection.  These were also  addressed with foot and wound care, antibacterial ointment, and a course  of doxycycline 100 mg b.i.d. for 14 days.   The patient was involved in the therapeutic milieu.  He was a reasonably  good participant, generally pleasant and cooperative throughout.   We encouraged the patient to consider referral to an assertive community  treatment team, as we felt that he would be a good candidate for such  support services.  However, he was not interested in this.  However, at  the time of discharge, he did accept referral to our outpatient clinic  for ongoing medication management, as well as to Dr. Redmond School, for whom an  appointment was scheduled on June 21, 2008.  He was to see Jorje Guild,  psychiatric physician's assistant in our outpatient clinic on July 06, 2008.  He was discharged on a regimen of Thorazine 50 mg b.i.d.,  Haldol 10 mg nightly, and the remainder of his doxycycline prescription.  He was also discharged with the remainder of his Bactroban ointment tube  to use until gone, on his feet, which have healed considerably.  DISCHARGE DIAGNOSES:  Axis I:  Schizophrenia, chronic paranoid type,  acute exacerbation, resolving and alcohol dependence, early remission.  Axis II:  Deferred.  Axis III:  Traumatic foot lesions, resolving.  Axis IV:  Stressors severe.  Axis V: Global Assessment of Functioning on discharge 55.      Anselm Jungling, MD  Electronically Signed     SPB/MEDQ  D:  06/16/2008  T:  06/16/2008  Job:  [id number]
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
## 2011-01-25 NOTE — Discharge Summary (Signed)
NAME:  [name], [name] NO.:  [id number]   MEDICAL RECORD NO.:  [id number]          PATIENT TYPE:  IPS   LOCATION:  0404                          FACILITY:  [facility]   PHYSICIAN:  Anselm Jungling, MD  DATE OF BIRTH:  1955-12-22   DATE OF ADMISSION:  09/04/2007  DATE OF DISCHARGE:  09/07/2007                               DISCHARGE SUMMARY   IDENTIFYING DATA/REASON FOR ADMISSION:  This was an inpatient  psychiatric admission for [name], a 55 year old divorced white male.  He has been admitted for facility in the past.  He presented this time  reporting that he was having auditory and visual hallucinations due to  using crack cocaine, and not being compliant with his prescribed  medication for his underlying psychotic disorder.  Please refer to the  admission note for further details pertaining to the symptoms,  circumstances and history that led to his hospitalization.  He was given  initial Axis I diagnosis of cocaine dependence, and psychosis NOS.   MEDICAL AND LABORATORY:  The patient was medically and physically  assessed by the psychiatric nurse practitioner.  He is in generally good  health without any active or chronic medical problems.  There were no  significant medical issues.   HOSPITAL COURSE:  The patient was admitted to the adult inpatient  psychiatric service.  He presented as a well-nourished, well-developed  male who was depressed, without overt signs or symptoms of psychosis,  but admitting to auditory hallucinations.  He admitted freely to his  cocaine dependence, and acknowledged needing to become abstinent from  drug and alcohol use.  He denied suicidal ideation.   The patient was involved in the therapeutic milieu.  He was restarted on  his usual regimen of Haldol and Thorazine.  In addition, Symmetrel 100  mg b.i.d. was given for cocaine craving.  The patient participated in  various therapeutic groups and activities.  On the fourth hospital  day,  he appeared appropriate for discharge, being absent and further  complaints referable to psychosis, and appearing well-organized.  He  agreed following aftercare plan.   AFTERCARE:  The patient was to follow-up with Dr. Donell Beers with an  appointment on October 07, 2007.  In addition he was to follow-up with  Evelena Peat for individual therapy on September 08, 2007.   DISCHARGE MEDICATIONS:  1. Haldol 5 mg b.i.d.  2. Symmetrel 100 mg b.i.d.  3. Thorazine 25 mg daily.   DISCHARGE INSTRUCTIONS:  The patient was encouraged to attend narcotics  anonymous meetings and get a sponsor.   DISCHARGE DIAGNOSES:  AXIS I: Psychosis NOS and cocaine dependence,  early remission.  AXIS II: Deferred.  AXIS III: No acute or chronic illnesses.  AXIS IV: Stressors severe.  AXIS V: GAF on discharge 55.      Anselm Jungling, MD  Electronically Signed     SPB/MEDQ  D:  09/23/2007  T:  09/23/2007  Job:  [id number]

## 2011-01-25 NOTE — Discharge Summary (Signed)
NAME:  [name], [name] NO.:  [id number]   MEDICAL RECORD NO.:  [id number]          PATIENT TYPE:  IPS   LOCATION:  0302                          FACILITY:  [facility]   PHYSICIAN:  Jeanice Lim, M.D. DATE OF BIRTH:  02/10/1956   DATE OF ADMISSION:  02/18/2005  DATE OF DISCHARGE:  02/24/2005                                 DISCHARGE SUMMARY   IDENTIFYING DATA:  A 55 year old divorced Caucasian male, voluntarily  admitted, referred by Dr. Lolly Mustache did not take his medications for 2 weeks,  inability to afford medications prescribed, needing generic medications,  reporting auditory hallucinations, somewhat irritable and agitated and  possibly unpredictable due to psychotic symptoms, felt not to be safe after  being seen in the outpatient office by Dr. Lolly Mustache and referred upstairs.  Past psychiatric history:  Inpatient [facility] May 2006,  April 2006, February 2004.  [facility] in 1999.  Two  hospitalizations in [place], [facility] and [facility].  Had been followed up in the past by the [facility], unable to see  psychiatrist due to psychiatrist being out on medical leave and the patient  was transferred to Dr. Lolly Mustache at [facility].   MEDICATIONS:  Mellaril, Wellbutrin, Depakote, Risperdal, unable to afford  Depakote and Risperdal due to not coming in generic form.   ALLERGIES:  No known drug allergies.   PHYSICAL AND NEUROLOGICAL EXAMINATION:  Essentially within normal limits.   ROUTINE ADMISSION LABS:  Essentially within normal limits.   MENTAL STATUS EXAM:  Alert and oriented, good eye contact, inappropriate  affect, calm, cooperative but could become irritable, anxious, fidgety, was  guarded and denying any psychotic symptoms although clearly paranoid and  somewhat labile, unclear what he was reacting to when becoming labile or  agitated.  Cognitively intact.  Judgment and insight were impaired.   ADMISSION DIAGNOSES:  AXIS I:  Schizophrenia, acute exacerbation due to  noncompliance with medications.  AXIS II:  Deferred.  AXIS III:  Status post history of testicular cancer and post partial lumbar  diskectomy.  AXIS IV:  Mild to moderate, problems with primary support group, occupation  problems, and chronic mental illness.  AXIS V:  36/55.   HOSPITAL COURSE:  The patient was admitted and ordered routine p.r.n.  medications, underwent further monitoring, and was encouraged to participate  in individual, group and milieu therapy.  He was monitored for safety,  monitored medically and optimized on medications.  The patient reported  motivation to be compliant with medications, importance of compliance and  insight regarding importance of adherence with medications was reviewed  daily and the patient reported a positive response and was discharged in  improved condition with no psychotic symptoms.  Mood euthymic, affect full,  no dangerous ideation, motivation as well of sense of financial ability to  be able to afford the generic medications the patient had been stabilized  on.  The patient was discharged on:  1.  Haldol 5 mg 1/2 q.a.m. and 1 q.h.s.  2.  Cogentin 0.5 mg b.i.d.  3.  Thorazine 75 mg q.9  p.m., 4 p.m. and 150 mg q.8 p.m.  4.  Doxepin 50 mg q.h.s.   DISPOSITION:  The patient is to follow up with Dr. Lolly Mustache, Monday, June 19  at 2:45.   DISCHARGE DIAGNOSES:  AXIS I:  Schizophrenia, acute exacerbation due to  noncompliance with medications.  AXIS II:  Deferred.  AXIS III:  Status post history of testicular cancer and post partial lumbar  diskectomy.  AXIS IV:  Mild to moderate, problems with primary support group, occupation  problems, and chronic mental illness.  AXIS V: Global assessment of function on discharge was 55.       JEM/MEDQ  D:  03/28/2005  T:  03/28/2005  Job:  [id number]

## 2011-01-25 NOTE — Discharge Summary (Signed)
NAME:  [name], [name] NO.:  [id number]   MEDICAL RECORD NO.:  [id number]          PATIENT TYPE:  IPS   LOCATION:  0402                          FACILITY:  [facility]   PHYSICIAN:  Jasmine Pang, M.D. DATE OF BIRTH:  08/20/1956   DATE OF ADMISSION:  10/27/2006  DATE OF DISCHARGE:  10/31/2006                               DISCHARGE SUMMARY   IDENTIFYING INFORMATION:  A 55 year old divorced white male who was  admitted on a voluntary basis on October 27, 2006.   HISTORY OF PRESENT ILLNESS:  The patient has a history of psychosis.  He  has been experiencing visual hallucinations.  He describes watching  movies of accidents and other disturbing events.  He can see them on  the wall.  He states he can detach himself and try to stop watching for  a while but then they begin again.  He has not been sleeping well.  He  has been binging on cocaine at about $75-$100 a day.  He has been  wondering if he should be living alone.  He has no suicidal ideation.  There are positive auditory hallucinations in addition to the positive  visual hallucinations.  The patient was here at [facility] x3.  The last  admission was April, 2007.  Jorje Guild, PA, is his treatment Brithany Whitworth.  There is no other psychiatric treatment.  The patient has no acute or  chronic medical problems.   ALLERGIES:  No known drug allergies.   MEDICATIONS:  He is on doxepin:  1. 50 mg p.o. at bedtime.  2. Haldol 10 mg in the morning and 5 mg at bedtime.  3. Thorazine 75 mg at 9 a.m. and 4 p.m. and 150 mg p.o. at bedtime.   PHYSICAL FINDINGS:  A complete physical exam was performed by our nurse  practitioner.  There were no acute medical problems.   ADMISSION LABORATORIES:  Hemoglobin was 13.7, hematocrit 38.7.  Glucose  114, BUN was 4.  Urinalysis was negative.  Alkaline phosphatase was 121.  TSH is 1.52 (0.35-5.5).   HOSPITAL COURSE:  The patient was started on Haldol 10 mg p.o. upon  arrival and q.6 h p.r.n.  psychosis; and Ativan 1 mg p.o. q.4 h p.r.n.  agitation and withdrawal; Haldol 10 mg daily and 5 mg p.o. at bedtime;  Thorazine 75 mg in the morning, 25 mg in the afternoon and 100 mg p.o.  at bedtime; doxepin 50 mg p.o. at bedtime; Trazodone 50 mg p.o. at  bedtime p.r.n. insomnia, may repeat x1. Haldol was switched to 5 mg the  morning and 10 mg at bedtime.  Cogentin 2 mg IM or p.o. q.8 h p.r.n.  EPS.  On October 29, 2006, the patient's trazodone was discontinued.  He was started on Ambien 10 mg p.o. at bedtime, may repeat x1 p.r.n.  insomnia.  The patient tolerated these medications well with no  significant side effects.   Upon admission the patient was asleep when I first met him but woke up  and was pleasant.  He was non-spontaneous but cooperative, with fair eye  contact.  Mood  was depressed.  Affect constricted.  No visual  hallucinations.  Positive auditory hallucinations.  On October 28, 2006, the patient stated over the past couple weeks he had been having  auditory and visual hallucinations, like watching a movie on the wall  (car accidents, fights, other catastrophes).  No suicidal or homicidal  ideation.  He felt he could see people looking in the window.  However,  his sleep has been very poor and he would stay up until exhausted.  Appetite was good.  The patient talked about seeing Dr. Lolly Mustache and Jorje Guild, being treated with Haldol and Thorazine as well as doxepin for  depression.  On October 29, 2006, the patient stated he felt better.  He was not sleeping well the night before, so tried to stay up during  the day so he could sleep better at night.  His appetite was good.  His  visual hallucinations had resolved.  There were still some auditory  hallucinations.  He admitted to using a lot of cocaine prior to  admission.  He wanted to go to longer term treatment program.  He is not  sure whether he wants to give up his apartment or not.  On October 30, 2006, the  patient's mental status had improved.  The patient slept well  last night.  Appetite was good.  He has decided to go home to his  apartment first before looking at longer term places.  He has found one  rehab that seems to be a possibility.  He met with the owners of the  home but wants to me with the manager there before deciding on going  there.  He is excited about discharge tomorrow.   On November 06, 2006, mental status had improved markedly from  admission.  The patient was friendly, cooperative, talkative.  He had  good eye contact.  Speech was normal rate and flow.  Psychomotor  activity was within normal limits.  Mood was euthymic.  Affect wide  range.  No suicidal or homicidal ideation.  No thoughts of self  injurious behavior.  No auditory or visual hallucinations.  No paranoia  or delusions.  Thoughts were logical and goal-directed.  Thought content  no predominant theme. The cognitive exam was grossly back to baseline.  The patient was felt safe to be transitioned home today.   DISCHARGE DIAGNOSES:  AXIS I:  Schizophrenia, paranoid type.  Cocaine  dependence.  AXIS II:  None.  AXIS III:  No acute or chronic problems.  AXIS IV:  Severe.  (Problems with primary support group, problems  related to social environment, housing problem, other psychosocial  problems--burden of psychiatric illness.)  AXIS V:  Global assessment of functioning upon discharge was 50.  Global  assessment of functioning upon admission was 30.  Global assessment of  functioning highest past year was 60-65.   DISCHARGE/PLAN:  There were no specific activity level or dietary  restrictions.   DISCHARGE MEDICATIONS:  1. Thorazine 100 mg 1 p.o. b.i.d.  2. Sinequan 50 mg p.o. at bedtime.  3. Haldol 5 mg daily and 10 mg at bedtime.  4. Ambien 10 mg at bedtime if needed for insomnia.  5. Cogentin 2 mg every 8 hours if needed for muscle stiffness or EPS.   POST HOSPITAL CARE PLANS: 1. The patient will  see Jorje Guild at [facility] on Tuesday, November 04, 2006, at 1:30 p.m.  2.  He will also be seen at the [facility] [place] on      November 06, 2006, at 2 p.m. for counseling.      Jasmine Pang, M.D.  Electronically Signed     BHS/MEDQ  D:  10/31/2006  T:  10/31/2006  Job:  [id number]

## 2011-01-25 NOTE — Discharge Summary (Signed)
NAME:  [name], [name] NO.:  [id number]   MEDICAL RECORD NO.:  [id number]          PATIENT TYPE:  IPS   LOCATION:  0306                          FACILITY:  [facility]   PHYSICIAN:  Jasmine Pang, M.D. DATE OF BIRTH:  Mar 19, 1956   DATE OF ADMISSION:  03/03/2006  DATE OF DISCHARGE:  03/07/2006                                 DISCHARGE SUMMARY   IDENTIFYING INFORMATION:  The patient is a 55 year old divorced Caucasian  male who was admitted on a voluntary basis on March 03, 2006.   HISTORY OF PRESENT ILLNESS:  The patient has a history of schizophrenia but  functions very well in terms of his being able to live alone.  He currently  has disability.  He has a history of cocaine abuse and came to the hospital  because he states he has been indulging.  He feels it is out of control  and he is not going to be able to stop.  He relapsed approximately a month  ago after being clean for eight months.  His last use was on Sunday, the day  before admission.  He has a history of hearing voices as well.  He reports  feeling agitated and having suicidal ideation.  He denied homicidal  ideation.  He hears a radio in his head, he says.  Apparently, his auditory  hallucinations are fairly chronic.  He got himself back with his sponsor but  was feeling suicidal and needed hospitalization.  He had no particular plan.  The patient was here in 2006.  He is seen by Dr. Lolly Mustache and Jorje Guild, PA in  the [facility].  His last visit was last  week.  He has had no history of suicide attempt.   SOCIAL HISTORY:  The patient is a 55 year old divorced male with two  children, ages 64 and 2.  He lives alone.  He is on disability.  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   FAMILY HISTORY:  Brother committed suicide five years ago.   ALCOHOL/DRUG HISTORY:  The patient smokes cigarettes.  He denies any  alcohol.  There  has been a history of IV drug use.  His primary drug of  choice is cocaine.   MEDICAL HISTORY:  The patient is healthy.   MEDICATIONS:  Haldol 10 mg in the morning and 1 mg at bedtime.  He is on  Thorazine 75 mg at 9 a.m. and 4 p.m. and Thorazine 150 mg q.h.s.  Cogentin  0.5 mg b.i.d. and doxepin 50 mg p.o. q.h.s.   ALLERGIES:  No known drug allergies.   MENTAL STATUS EXAM:  Upon admission, the patient was alert and cooperative  with good eye contact.  He was casually dressed.  Speech was clear.  His  affect was constricted.  Mood was neutral.  He was having positive auditory  hallucinations.  These were not command hallucinations.  He stated  previously he hears a radio in his head.  Cognitive fairly intact.  The  patient  very intelligent.   PHYSICAL EXAMINATION:  Physical examination was done here by our nurse  practitioner.  The patient was found to be a middle-aged male without acute  distress.  He was healthy.   LABORATORY DATA:  The comprehensive metabolic panel was remarkable for a  slightly decreased potassium of 3.4, an elevated glucose of 118 (70-99), BUN  of 5 which was low and an albumin of 3.4 which was low.  The CBC was grossly  within normal limits except for decreased hematocrit at 37.5.  Hepatic  profile was positive for SGOT being 20, SGPT 16, alkaline phosphatase 109,  bilirubin 0.6.  TSH was within normal limits.  Urinalysis was within normal  limits.   HOSPITAL COURSE:  Upon admission, the patient was prescribed Ambien 10 mg  p.o. q.h.s. p.r.n. insomnia, may repeat x1.  On admission, he was given a  nicotine transdermal patch 21 mg as per smoking cessation protocol.  On March 04, 2006, the patient was started on Haldol 10 mg in the morning and 5 mg  q.h.s., Thorazine 75 mg p.o. b.i.d. and 150 mg q.h.s., Cogentin 0.5 mg p.o.  b.i.d., doxepin 50 mg p.o. q.h.s.  On January 03, 2006, he was started on  amantadine for his cravings.  The patient tolerated his  medications well  with much improvement in his mental status.   Upon first meeting the patient, on March 04, 2006, he talked about abusing  cocaine and had significant feelings of guilt, shame and hopelessness.  He  is worried he cannot stop using.  He uses $100 a day.  Mood was depressed.  Affect constricted.  He was having no suicidal or homicidal ideation that  day and contracted for safety.  On March 05, 2006, the patient continued to  talk about his guilt and shame about his cocaine use.  He was still anxious  and depressed.  He was agitated, still fearful he could not give up cocaine.  He has children who live with his ex.  They are moving to [place].  He is sad  about this because he does not drive that far.  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   On March 06, 2006, the patient was doing fairly well.  He slept well.  Appetite was fair.  He feels that it was the right decision to come here.  He has been developing specific strategies to not use cocaine when he goes  home.  The people that he uses with are not near his neighborhood and he  feels he will be able to stay away from them.  On March 07, 2006, the  patient's mental status had improved greatly from admission.  He was  friendly and cooperative with good eye contact.  Speech normal rate and  flow.  Some retardation but grossly within normal limits.  Mood was less  depressed and anxious.  There was still some dysphoria regarding the worry  about being able to stop cocaine.  Affect revealed wider range, though he  was still somewhat constricted.  There was no suicidal or homicidal  ideation.  No self-injurious behavior.  He did not report any auditory  hallucinations at this time.  He has no visual hallucinations.  No paranoia  or delusions.  Thoughts logical and goal directed.  Linear.  Cognitive exam was grossly intact.  The patient is very intelligent and used to practice as  a Clinical research associate.  His  insight and judgment  are good.  Attention and concentration  are good.  He felt ready for discharge.   DISCHARGE DIAGNOSES:  AXIS I:  Schizophrenia.  Cocaine dependence.  AXIS II:  None.  AXIS III:  Healthy.  AXIS IV:  Moderate (problems with primary support group, medical problems,  burden of his illness, other psychosocial problems).  AXIS V:  GAF on discharge 43; current GAF on admission 30; GAF highest past  year 83.   ACTIVITY/DIET:  There were no specific activity level or dietary  restrictions.   DISCHARGE MEDICATIONS:  1.  Haldol 10 mg daily in the morning and 5 mg q.h.s.  2.  Cogentin 0.5 mg p.o. b.i.d.  3.  Doxepin 50 mg p.o. q.h.s.  4.  Chlorpromazine 75 mg at 9 a.m. and 4 p.m. and 150 mg q.h.s.  5.  Amantadine 100 mg p.o. b.i.d.  6.  Ambien 10 mg p.o. q.h.s. p.r.n. insomnia.   POST-HOSPITAL CARE PLANS:  The patient will see Yolande Jolly, PA and Dr.  Lolly Mustache for follow-up at the [facility].  He has an  appointment on Tuesday, March 11, 2006 at 11:45 a.m.      Jasmine Pang, M.D.  Electronically Signed     BHS/MEDQ  D:  03/07/2006  T:  03/07/2006  Job:  [id number]

## 2011-01-25 NOTE — Discharge Summary (Signed)
NAME:  [name], [name] NO.:  [id number]   MEDICAL RECORD NO.:  [id number]          PATIENT TYPE:  IPS   LOCATION:  0501                          FACILITY:  [facility]   PHYSICIAN:  Jeanice Lim, M.D. DATE OF BIRTH:  [birth year]   DATE OF ADMISSION:  01/08/2005  DATE OF DISCHARGE:  01/14/2005                                 DISCHARGE SUMMARY   IDENTIFYING DATA:  This is a 55 year old Caucasian male, divorced,  presenting to the emergency room putting a plastic bag over his head,  claiming that he was trying to kill himself by suffocation.  Reported he  relapsed on cocaine six months ago.  Had been using periodically in a binge  pattern whenever he had money available.  He was one month away from being  totally homeless.  Using a large amount of cocaine.  Used cocaine since age  78.  History of IV drug use.  He is currently using crack cocaine, socially  isolated in [place], divorced.  Family not supportive of him seeing  teenage children.  No reason to go on with his life.  Hearing voices but no  commands to kill himself.   PAST PSYCHIATRIC HISTORY:  Previously seen by Meredith Staggers, outpatient  psychiatrist, and then [facility].  Next appointment on  May 9th.  Second admission to [facility].  Prior admission being one month ago.  History of outpatient treatment.  Come to [facility] IOP at [facility].  History of schizophrenia diagnosed many years ago.   ALCOHOL/DRUG HISTORY:  Cocaine is primary drug of choice with blunts.  Auditory hallucinations.  Makes him feel better and uses $200 a day if  possible.   PAST MEDICAL HISTORY:  Remarkable for testicular cancer, had surgery on his  back and history of prior dental work.   ALLERGIES:  No known drug allergies.   MEDICATIONS:  Mellaril (which he had been on for many years) 100 mg twice a  day and 200 mg q.h.s., Wellbutrin XL 150 mg q.a.m. (unable to get a  prescription, only permitted to  purchase generic so therefore went off  this).   PHYSICAL EXAMINATION:  Physical and neurologic exam within normal limits.   LABORATORY DATA:  Routine admission labs essentially within normal limits  including liver function tests.  Urine drug screen positive for cocaine.  Alcohol level less than 5.  TSH 0.7.   MENTAL STATUS EXAM:  Fully alert, pleasant, cooperative, anxious, blunted,  disheveled.  Speech revealed some pressure.  Mood was depressed and  hopeless, restricted.  The patient felt no reason to go on with passive  suicidal ideation and at times active with a plan to overdose but  contracting in the hospital.  Does have auditory and visual hallucinations  without commands.  Judgment and insight were impaired.  Cognitively intact.   ADMISSION DIAGNOSES:   AXIS I:  1.  Major depressive disorder, recurrent.  2.  More likely schizoaffective disorder, depressed phase.   AXIS II:  None.   AXIS III:  None.   AXIS IV:  Severe (problems with polysubstance abuse  and social isolation).   AXIS V:  25/55.   HOSPITAL COURSE:  The patient was admitted and ordered routine p.r.n.  medications and underwent further monitoring.  Was encouraged to participate  in individual, group and milieu therapy.  The patient was started on  Symmetrel for cocaine cravings and Mellaril was continued.  The patient  aware of QTC black box warning and patient was monitored for safety.  The  patient reported a positive response to medications and suggested that there  was significant improvement.   CONDITION ON DISCHARGE:  He was discharged in improved condition.  No acute  withdrawal symptoms.  Highly motivated to follow up in the outpatient  setting with relapse prevention, although aware that this will be a  challenge for him in addition to take medication as prescribed.  Again, he  was given medication education.   DISCHARGE MEDICATIONS:  1.  Mellaril 100 mg at 8:30 a.m., 5 p.m. and 200 mg at 9  p.m.  2.  Wellbutrin SR 100 mg daily.  Samples of XL given, however he needed      generic medication.  3.  Risperdal 1 mg, 1/2 at 9 p.m., 4 p.m. and 2 at 8 p.m.  The patient was      aware he can get samples or assistance for this.  4.  Depakote ER 500 mg at 8 p.m. and Depakote ER 250 mg at 8 p.m., totally      750 mg.   FOLLOW UP:  The patient was to avoid alcohol and drug use and to follow up  at [facility] walk-in clinic within three days.   DISCHARGE DIAGNOSES:   AXIS I:  1.  Major depressive disorder, recurrent.  2.  More likely schizoaffective disorder, depressed phase.   AXIS II:  None.   AXIS III:  None.   AXIS IV:  Severe (problems with polysubstance abuse and social isolation).   AXIS V:  Global Assessment of Functioning on discharge 55.     JEM/MEDQ  D:  02/23/2005  T:  02/23/2005  Job:  [id number]

## 2011-01-25 NOTE — Discharge Summary (Signed)
NAME:  [name], WALROND NO.:  [id number]   MEDICAL RECORD NO.:  [id number]          PATIENT TYPE:  IPS   LOCATION:  0404                          FACILITY:  [facility]   PHYSICIAN:  Geoffery Lyons, M.D.      DATE OF BIRTH:  12/09/1955   DATE OF ADMISSION:  12/16/2004  DATE OF DISCHARGE:  12/20/2004                                 DISCHARGE SUMMARY   CHIEF COMPLAINT AND PRESENT ILLNESS:  This was the first admission to [facility] for this 55 year old single white male, divorced, who  was admitted with suicidal ideation, planning to run out in traffic or to  buy a razor blade and cut his wrist.  Hearing voices that narrate his  thoughts.  The patient's brother killed himself three years ago by  strangulation.  He was requesting treatment for his suicidal ideation.  Also  admitted to using crack once a month, spending $100 a month on crack.  Diagnosed with schizophrenia.  Endorsed that he was overwhelmed and thought  it was a way to get away from his problems.  He was contracting for safety  while in the hospital.  His brother was also schizophrenic.  Past history of  IV drug use.   PAST PSYCHIATRIC HISTORY:  Had been hospitalized before, had multiple trials  with medications and he has done the best on Mellaril 200 mg twice a day and  400 mg at bedtime.  He was diagnosed 10 years ago.  Was in rehab center in  [place] in 2004 and then [facility].  Been followed by Dr.  Meredith Staggers every three months.   ALCOHOL/DRUG HISTORY:  Acknowledged use of crack cocaine at least once a  month.  No other active substance use.   MEDICAL HISTORY:  Status post back surgery and status post testicular  cancer.   MEDICATIONS:  Mellaril 200 mg twice a day and 400 mg at night.   PHYSICAL EXAMINATION:  Performed and failed to show any acute findings.   LABORATORY DATA:  CBC with hemoglobin 12.7.  Blood chemistry with glucose  117.  Liver enzyme with SGOT 19,  SGPT 19.  TSH 0.833.   MENTAL STATUS EXAM:  Alert, cooperative male.  Appropriately groomed and  dressed.  Speech was normal in rate, rhythm and tone.  Mood was depressed.  Affect was constricted.  Thought processes were logical, coherent and  relevant.  No evidence of acute delusions.  If anything, there was some  poverty of thought content.  Suicidal ideation, although could contract for  safety.  No active hallucinations.   ADMISSION DIAGNOSES:   AXIS I:  1.  Schizophrenia.  2.  Cocaine abuse.   AXIS II:  No diagnosis.   AXIS III:  1.  Status post back surgery.  2.  Testicular cancer in 1990.   AXIS IV:  Moderate.   AXIS V:  Global Assessment of Functioning upon admission 35; highest Global  Assessment of Functioning in the last year 60.   HOSPITAL COURSE:  He was admitted and started in individual and group  psychotherapy.  He was given Ambien for sleep.  He was maintained on  Mellaril.  He was placed on Wellbutrin XL 150 mg in the morning.  He  endorsed he got very frustrated.  [place] that the family felt he was taking  too many things on himself.  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  Wants to keep his car but, if he does not go out and make the money, he will  have to let go of the car.  Recently has had more increased thoughts about  his brother who killed himself.  He endorsed the suicidal ruminations.  Endorsed he has been more depressed and wanted to try the Wellbutrin.  Very  concerned, worried about the decision, if he was going to keep the car.  If  he was going to keep the car, he would have to do the day labor.  The family  was pushing for him to simplify situation, let go of the car and basically  adjust himself to what he was getting from disability.  He felt he could do  more than that but, at the same time, was trying to listen to what they had  to say.  By April 12th, he had made  the decision.  On April 13th, he was  much better.  Endorsed no suicidal ideation, no homicidal ideation, no  hallucinations, no delusions.  Insightful.  He was going to get himself back  to where he was living.  He was going to see how much money he had, make a  decision, probably of letting go of the car and simplify his life but not  give up on the idea of eventually doing substitute teaching.   DISCHARGE DIAGNOSES:   AXIS I:  1.  Schizophrenia.  2.  Rule out schizoaffective disorder, depressed.   AXIS II:  No diagnosis.   AXIS III:  1.  Status post back surgery.  2.  Status post testicular cancer.   AXIS IV:  Moderate.   AXIS V:  Global Assessment of Functioning upon discharge 50.   DISCHARGE MEDICATIONS:  1.  Mellaril 200 mg, 1 twice a day and 2 at night.  2.  Wellbutrin XL 150 mg in the morning.  3.  Ambien 10 mg at bedtime for sleep.   FOLLOW UP:  [facility], [place].      IL/MEDQ  D:  01/11/2005  T:  01/12/2005  Job:  [id number]

## 2011-01-25 NOTE — H&P (Signed)
NAME:  [name], [name] NO.:  [id number]   MEDICAL RECORD NO.:  [id number]          PATIENT TYPE:  IPS   LOCATION:  0302                          FACILITY:  [facility]   PHYSICIAN:  Jeanice Lim, M.D. DATE OF BIRTH:  06-05-1956   DATE OF ADMISSION:  02/18/2005  DATE OF DISCHARGE:                         PSYCHIATRIC ADMISSION ASSESSMENT   IDENTIFYING INFORMATION:  This is a 55 year old divorced white male who was  voluntarily admitted to the [facility] on February 18, 2005.   HISTORY OF PRESENT ILLNESS:  The patient was referred by Dr. Lolly Mustache due to  noncompliance with his medications for two weeks.  The patient reports an  inability to afford his medications and needs to be prescribed generic  medications.  He began hearing auditory hallucinations of voices in his head  that he describes as more as ruminating thoughts, no external auditory  hallucinations.  He also states that he is very depressed, feeling hopeless  for several days.  He has poor ability to get to sleep but, once he gets to  sleep, he is able to sleep for eight or nine hours per night.  He reports  that his appetite is good.  He denies any suicidal or homicidal ideation.  Denies any visual hallucinations.  Denies any manic behavior.  He reports  that a major stressors in his life right now is a difficult relationship  with his ex-wife and his inability to see his two children, who live with  her.   PAST PSYCHIATRIC HISTORY:  He was inpatient at [facility] in  May of 2006 and April of 2006; also in February of 2004.  He has also been  inpatient at [facility] in 1999 and at two hospitals in [place], [facility] and [facility].  He has been seen on an  outpatient basis previously by the [facility].  It says that he was  unable to see the psychiatrist there because the psychiatrist is out on  medical leave, so he is now being seen on an outpatient  basis [facility]   SOCIAL HISTORY:  He lives alone in [place].  He is divorced.  He has two  kids, a daughter and a son, 74 and 68 years old.  He collects social  security disability.  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  He has no local social support.  He has a brother in [place] who he speaks with occasionally.  He denies  any legal problems at this time.   FAMILY HISTORY:  He says he has a brother who is schizophrenic.  He has  another brother who committed suicide.  He denies any substance abuse in his  family.   ALCOHOL/DRUG HISTORY:  He admits that he used cocaine in the past.  He quit  approximately one year ago after the IOP program at the [facility] but he had a slip approximately a month ago.  He says that he drinks  an occasional beer but he does not have any abuse problems with alcohol.   PRIMARY CARE PHYSICIAN:  Dr. Yetta Barre or a Dr. Alwyn Ren at the Urgent Care  facility on [facility].   MEDICAL PROBLEMS:  He smokes a pack of cigarettes per day for the past 10  years, although he is currently trying to quit.  He is status post  testicular cancer and he has low back pain and is status post partial  diskectomy in his lumbar area.   MEDICATIONS:  Mellaril 100 mg twice daily and 200 mg at bedtime, Wellbutrin  SR 100 mg daily, Depakote ER 750 mg at 8 p.m., Risperdal 0.5 mg at 9 a.m.  and at 4 p.m. and 1 mg at 8 p.m.  He says that he is unable to afford the  Depakote and the Risperdal as they do not come in generic form.   ALLERGIES:  No known drug allergies.   REVIEW OF SYSTEMS:  Positive for presbyopia.  He says that his dentures are  broken.  He has urinary hesitation that he has had since childhood and he  claims an intolerance to the heat.   PHYSICAL EXAMINATION:  VITAL SIGNS:  His vital signs, at the [facility], were temperature  97.9, pulse 60, respirations 20, blood  pressure 123/75.  GENERAL:  On physical examination, he is a tall, thin, well-nourished, well-  developed white male who looks appropriate for his stated age of 5 years.  He is 73 inches tall and 171 pounds.  He has poor dentition.  NECK:  Full range of motion with 5/5 strength and no lymphadenopathy.  CHEST:  Clear to auscultation bilaterally.  BREASTS:  Exam was deferred.  HEART:  Regular rate and rhythm without murmurs, rubs, or gallops.  There  were no carotid bruits heard on auscultation and he had equal peripheral  pulses.  ABDOMEN:  Flat, guarded, nontender, with positive bowel sounds.  GU:  Exam was deferred.  EXTREMITIES:  Full range of motion with 5/5 strength.  SKIN:  Warm and dry without lesion.  NEUROLOGIC:  Exam was grossly intact.   MENTAL STATUS EXAM:  He is alert and oriented x 4.  He had good eye contact  with inappropriate affect.  His appearance was casual.  His behavior was  calm and cooperative.  His speech was clear with even pace and tone.  His  mood was anxious as he was fidgety.  Thought process was coherent without  suicidal or homicidal ideation.  No psychosis or mania was observed.  Cognitive function with concentration normal.  His memory was intact.  His  impulse control was good.  His insight was good.   DIAGNOSES:   AXIS I:  Schizophrenia.   AXIS II:  Deferred.   AXIS III:  1.  Status post testicular cancer.  2.  Status post partial lumbar diskectomy.   AXIS IV:  Mild to moderate (problems with his primary support group and  occupational problems).   AXIS V:  Current Global Assessment of Functioning 36; past year range of 55-  60.   INITIAL PLAN:  To admit the patient voluntarily.  Resume his medications.  Stabilize him to thought and mood.  We will work to increase his coping skills and decrease his stressors.  Before discharge, he will be changed  over to generic medications and he will follow up  with Dr. Lolly Mustache on an  outpatient basis.   TENTATIVE LENGTH OF STAY:  Four to  seven days.       AHW/MEDQ  D:  02/19/2005  T:  02/19/2005  Job:  [id number]

## 2011-01-25 NOTE — Discharge Summary (Signed)
NAME:  [name], [name] NO.:  [id number]   MEDICAL RECORD NO.:  [id number]          PATIENT TYPE:  IPS   LOCATION:  0407                          FACILITY:  [facility]   PHYSICIAN:  Anselm Jungling, MD  DATE OF BIRTH:  24-Oct-1955   DATE OF ADMISSION:  11/05/2007  DATE OF DISCHARGE:  11/12/2007                               DISCHARGE SUMMARY   IDENTIFYING DATA AND REASON FOR ADMISSION:  This was one of many  inpatient psychiatric admissions for [name], a 55 year old single white  male who is well-known to [place].  He returned to [place] with his history of  psychotic disorder and substance abuse.  He had stopped taking his  regimen of Haldol and Thorazine and decompensated, with an increase in  symptoms including auditory hallucinations.  Most recently he had been a  patient of Dr. Donell Beers.  He had been homeless, but expressed interest in  being placed in an assisted-living facility.  Please refer to the  admission note for further details pertaining to the symptoms,  circumstances and history that led to his hospitalization.  He was given  initial Axis I diagnoses of schizophrenia, chronic paranoid type, and  history of cocaine abuse.   MEDICAL AND LABORATORY:  The patient was medically and physically  assessed by the psychiatric nurse practitioner.  He was in good health  without any active or chronic medical problems.  There were no  significant medical issues.   HOSPITAL COURSE:  The patient was admitted to the adult inpatient  psychiatric service.  He presented as a tall slender, normally-developed  gentleman of above-average intelligence.  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  He was pleasant and cooperative.  He did not show much in the way of  outward signs or symptoms of psychosis, or of auditory hallucinations.  His mood was dysphoric with grim affect.   The patient agreed to a restarting his usual  medications including  Haldol and Thorazine.  These were adjusted throughout his inpatient  stay.   The patient worked closely with the psychiatric case manager towards  finding an assisted-living situation for the patient.   In addition, the psychiatric case manager arranged for the patient to  follow up in our outpatient clinic.  The patient ultimately identified  the homeless shelter as his preferred  Disposition following his discharge.  It seems that, although he  receives reasonably good level of disability support financially, he did  not want to turn over any of his funds to an assisted living program.  The patient agreed to the following aftercare plan.   AFTERCARE:  The patient was follow up with Jorje Guild at [facility]  outpatient department on December 04, 2007.   DISCHARGE MEDICATIONS:  Thorazine 50 mg q.h.s. and Haldol 15 mg q.h.s.   DISCHARGE DIAGNOSES:  AXIS I: Schizophrenia, chronic paranoid type,  acute exacerbation, resolving, history of cocaine abuse.  AXIS II: Deferred.  AXIS III: No acute or chronic illnesses.  AXIS IV: Stressors severe.  AXIS V: GAF on discharge 55.  Anselm Jungling, MD  Electronically Signed     SPB/MEDQ  D:  11/13/2007  T:  11/15/2007  Job:  [id number]

## 2011-01-25 NOTE — H&P (Signed)
NAME:  [name], [name] NO.:  [id number]   MEDICAL RECORD NO.:  [id number]          PATIENT TYPE:  IPS   LOCATION:  0404                          FACILITY:  [facility]   PHYSICIAN:  Jeanice Lim, M.D. DATE OF BIRTH:  [birth year]   DATE OF ADMISSION:  12/16/2004  DATE OF DISCHARGE:                         PSYCHIATRIC ADMISSION ASSESSMENT   This is a voluntary admission to the services of Dr. Aleatha Borer.   IDENTIFYING INFORMATION:  This is an 55 year old divorced white male.  He  presented to the emergency room yesterday with increasing suicidal ideation.  He had had thoughts of running out in traffic or buying a razor blade and  cutting on himself.  He stated that his thoughts of suicide were worse than  usual. He is a private patient of Dr. Meredith Staggers.  He is currently  prescribed Mellaril.  I did call [facility] and they are in  receipt of a prescription dated April 7.  The prescription was for Mellaril  200 mg b.i.d. and 400 mg at h.s.  As they did not have this medication in  stock they were not able to fill the prescription, which is probably why the  patient presented.   PAST PSYCHIATRIC HISTORY:  He states that he was diagnosed with  approximately 10 years ago.  He was first in a rehab center in [place] in  2004, then he was at [facility].  He has been followed by Dr. Meredith Staggers  since then on an outpatient basis, usually every 3 months.  He had some  other hospitalizations up in [place] in 1996, and it is unclear whether  that was due to drugs or schizophrenia or what.   SOCIAL HISTORY:  He states that he graduate [address] in [place], the  [address] [place] [address].  That he was married for about 10 years.  He has a  6 year old daughter, a 54 year old son.  He moved here to [place]  because his wife moved here from [place].  She had family in [place].  However, in the past 5 years his wife has not allowed much  contact with the children, hence, he is thinking of moving back to [place].  His mother is living in [place] as are three brothers.  One of  these brothers has schizophrenia, that's the person he is closest to.   FAMILY HISTORY:  His oldest brother suicided; this brother was also  diagnosed with schizophrenia.   ALCOHOL AND DRUG HISTORY:  He smokes one pack per day, and he has for the  past 10 years.  He acknowledges use of crack cocaine.  He claims he uses it  once a month, about $100 a month, and that he has only been using for a  couple of years.   MEDICAL HISTORY AND PRIMARY CARE Herminio Kniskern:  He states [facility] [address].  He states that he is status post back surgery and that  he is status post testicular cancer, that he is cancer free.  The back  surgery  was 1996.  The testicular cancer was in 1990.   CURRENT MEDICATIONS:  He is currently prescribed Mellaril as already  indicated.   ALLERGIES:  He has no known drug allergies.   PHYSICAL EXAMINATION:  He appears older than his stated age.  He has very  poor dental care, several missing teeth.  Otherwise, other than being  somewhat thin he had no remarkable findings as per the ER.   MENTAL STATUS EXAM:  He is alert and oriented x 3.  He is appropriately  groomed, dressed and nourished.  His speech is a normal rate, rhythm and  tone.  His mood is somewhat depressed.  His affect is congruent.  His  thought processes did not reveal any delusions or paranoia.  Judgment and  insight were intact.  Concentration and memory are intact.  Intelligence is  at least average.  He denies suicidal or homicidal ideation.  He denies  auditory or visual hallucinations.  He states that the Mellaril has pretty  much taken care of that.  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  He would need  a car, etc ,  etc.  So, that is his current predicament.   ADMISSION DIAGNOSES:   AXIS I:  1.  Schizophrenia diagnosed in 1996.  2.  Crack abuse, rule out dependence.   AXIS II:  Deferred.   AXIS III:  1.  Status post back surgery in 1993 in [place].  2.  Testicular cancer in 1990 in [place].   AXIS IV:  Severe problems with primary support group, economics, other  psychosocial problems.   AXIS V:  41.   PLAN:  The patient is concerned that his son may be developing schizophrenia  as he is exhibiting the same risk taking behaviors at this age as he did.  His son has recently had issues with the law regarding usage of marijuana.  It was recommended that he ask his wife to have his son evaluated at [facility] by Dr. Lenoard Aden who is currently investigating children of  schizophrenics.      MD/MEDQ  D:  12/16/2004  T:  12/16/2004  Job:  [id number]

## 2011-01-25 NOTE — Discharge Summary (Signed)
NAME:  [name], [name] NO.:  [id number]   MEDICAL RECORD NO.:  [id number]          PATIENT TYPE:  IPS   LOCATION:  0400                          FACILITY:  BH   PHYSICIAN:  Jasmine Pang, M.D. DATE OF BIRTH:  September 16, 1955   DATE OF ADMISSION:  12/09/2006  DATE OF DISCHARGE:  12/15/2006                               DISCHARGE SUMMARY   IDENTIFICATION:  [age]-year-old single white male who was admitted  on a voluntary basis on December 09, 2006.   HISTORY OF PRESENT ILLNESS:  The patient has a history of positive  auditory hallucinations.  He has been hearing persecutory voices telling  him he is worthless and commanding him to kill himself.  He recently was  discharged 1 month ago with similar symptoms.  He relapsed on crack-  cocaine several days before admission.  He has not been sleeping well.  The patient was here in October 31, 2006.  He sees Jorje Guild, [place] for  outpatient med management.  Patient is on Thorazine 100 mg b.i.d.,  Sinequan 50 mg p.o. nightly, Haldol 5 mg in the morning and 10 mg at  h.s., Ambien 10 mg p.r.n. insomnia, Cogentin 2 mg p.o. q.8 hours p.r.n.  EPS.  He has no known drug allergies.   PHYSICAL FINDINGS:  There were no acute physical problems.  The patient  was in no acute physical distress.   ADMISSION LABORATORIES:  The only laboratories available were a U[facility]  which he is positive for cocaine.  Other labs were done recently in the  hospital and not redone.   HOSPITAL COURSE:  Upon admission. patient was continued on his Thorazine  100 mg p.o. b.i.d., Sinequan 50 mg p.o. nightly, Haldol 5 mg p.o.  q.a.m., Haldol 10 mg p.o. nightly, Ambien 10 mg p.o. nightly p.r.n.  insomnia, Cogentin 2 mg p.o. q.8 hours p.r.n. EPS.  On December 11, 2006,  due to agitation, patient was started on Thorazine 50 mg p.o. q.6 hours  p.r.n. psychosis or agitation.  The patient tolerated his medications  well.  He began to feel better and there was not felt  to be a need to  change his medications at this point.  On first meeting patient upon  admission, patient stated he was hearing voices again.   Upon first meeting the patient upon admission, patient stated he was  hearing voices again.  He also had delusional thoughts, thinking he was  a Psychologist, occupational.  He felt anxious and unsafe at home.  His sleep was  poor, appetite poor.  He states he tried to suffocate himself with the  bag.  He was having positive visual hallucinations, seeing things when  he closes his eyes, and positive auditory hallucinations, hearing family  members talk to him.  He also talked about relapsing on cocaine and was  willing to consider the CDIOP for followup.  On December 11, 2006, patient  stated he was feeling agitated this was when Thorazine was started.  He  slept well.  Appetite was fair.  Mood was depressed.  He felt like I'm  in purgatory.  He felt that he would be better off dead but can  contract for safety here.  He has 2 children; ages 77 and 60 years old.  He feels they do not want to talk to him..  On December 12, 2006, patient  was friendly and cooperative.  His sleep was good with Ambien.  He was  able to fall asleep.  Appetite was within normal limits.  Mood depressed  when he woke up but this got better.  No suicidal or homicidal ideation.  Additional Thorazine helped with his agitation and psychosis.  He denies  auditory or visual hallucinations.  He plans to go to CDIOP when  discharged.  Side effects included some EPS/? akathisia, dry mouth and  thirst.  The patient's medications were continued as prescribed before.  On December 13, 2006, patient denied any hallucinations or suicidal  ideations.  On December 14, 2006, patient continued to be more stable.  He  was feeling that the Thorazine had helped with his hallucinations.   On December 15, 2006, mental status had improved markedly from admission  status.  The patient was friendly and cooperative with exam.  He  was  conversant.  He had good eye contact.  Speech was normal rate and flow.  Psychomotor activity was within normal limits.  His mood was euthymic.  Affect wide range.  No suicidal or homicidal ideation.  No thoughts of  self-injurious behavior.  No auditory or visual hallucinations.  No  paranoia or delusions.  Thoughts were logical and goal-directed.  Thought content no predominant theme.  Cognitive was grossly back to  baseline.   DISCHARGE DIAGNOSES:  AXIS I:  1. Schizophrenia, paranoid type.  2. Cocaine abuse (rule out dependence).  AXIS II:  None.  AXIS III:  No acute or chronic medical problems.  AXIS IV:  Moderate (problems with primary support group, burden of  psychiatric illness).  AXIS V:  Global assessment of functioning upon discharge was 50.  Global  assessment of functioning upon admission was 30.  Global assessment of  functioning highest past year was 65.   DISCHARGE PLANS:  There were no specific activity level or dietary  restrictions.   POST HOSPITAL CARE PLANS:  The patient will go to [facility] at [facility] to start on the day after admission.   DISCHARGE MEDICATIONS:  1. Thorazine 100 mg twice daily.  2. Sinequan 50 mg at bedtime.  3. Haldol 5 mg in the morning and 10 mg at bedtime.  4. Ambien 10 mg at bedtime if needed for sleep.  5. Cogentin 2 mg every 8 hours if needed for muscle stiffness.      Jasmine Pang, M.D.  Electronically Signed     BHS/MEDQ  D:  12/15/2006  T:  12/15/2006  Job:  [id number]

## 2011-01-25 NOTE — Discharge Summary (Signed)
NAME:  SY, SAINTJEAN NO.:  [id number]   MEDICAL RECORD NO.:  [id number]          PATIENT TYPE:  IPS   LOCATION:  0302                          FACILITY:  BH   PHYSICIAN:  Anselm Jungling, MD  DATE OF BIRTH:  1955/12/21   DATE OF ADMISSION:  01/23/2007  DATE OF DISCHARGE:  01/28/2007                               DISCHARGE SUMMARY   PSYCHIATRIC DISCHARGE NOTE.  Admitted 01/23/2007, discharged 01/28/2007.   IDENTIFYING DATA AND REASON FOR ADMISSION:  The patient is a 55 year old  male with a history  of schizophrenia and cocaine addiction.  He had  recently been discharged from our facility.  He quickly relapsed on  cocaine. He indicated that he did not believe he could live  independently and wanted to be in a group home or assisted living  facility.  He was reporting auditory hallucinations to kill himself and  stated that he had a plan.  Please refer to the admission note for  further details pertaining to the symptoms, circumstances and history  that lead to his hospitalization.  He was given an axis 1 initial  diagnosis of schizophrenic, chronic, acute exacerbation and depressive  disorder NOS, and cocaine addiction, rule out substance induced mood  disorder.   MEDICAL AND LABORATORY:  The patient was medically and physically by the  psychiatric nurse practitioner.  He was in good health without any  active or chronic medical problems.   HOSPITAL COURSE:  The patient was admitted to the adult inpatient  psychiatric service.  He presented as a tall, normally developed and  adequate nourished adult male with blunted affect, but who was fully  oriented, cooperative, and reasonably well organized. He was initially  seen by Dr. Milford Cage.  The undersigned assumed care of the patient  on the 4th hospital day.  On that day, the patient remained in bed at  the hour of the initial therapy group of the day.  He was able to wake  up enough to tell me that he  was tired and explained to me that he was  here for schizophrenia and depression.  He denied any specific  problems or complaints at that time.  However, he was guarded with no  eye contact and no spontaneous statements.   The patient had been treated with a regimen of Haldol and Thorazine.  His symptoms subsided gradually and by the final hospital day, the  patient was up, dressed, groomed and active in the milieu.  His affect  was somewhat flat, but he was quite pleasant, logical, and goal  oriented.  He discussed at length the pros and cons of living alone  versus assisted living, and indicated that he was interested in having  support from the ACT team.  He indicated that he felt that his  medications were adequate.  He denied auditory hallucinations and  suicidal ideation.  The patient was felt to be appropriate for discharge  at that time.  He agreed to the following aftercare plan:  After care  the patient was to followup at psychotherapeutic services with an  appointment  on 01/29/2007.  He was also to be followed by the ACT team.   DISCHARGE MEDICATIONS:  1. Thorazine 75 mg twice daily and 150 mg at bed.  2. Haldol 10 mg at bed and 5 mg in the morning.  3. Sinequan 50 mg at bed.   DISCHARGE DIAGNOSES:  Axis 1: Schizophrenia, chronic paranoid type,  acute exacerbation, resolving and depressive disorder NOS.  Axis 2:     Deferred.  Axis 3:     No acute or chronic illnesses.  Axis 4:     Stricture severe.  Axis 5:     GAF on discharge 65.      Anselm Jungling, MD  Electronically Signed     SPB/MEDQ  D:  02/03/2007  T:  02/03/2007  Job:  [id number]
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
## 2011-01-30 ENCOUNTER — Encounter (HOSPITAL_COMMUNITY): Payer: Self-pay | Admitting: Physician Assistant

## 2011-04-16 ENCOUNTER — Emergency Department (HOSPITAL_COMMUNITY): Payer: Medicare Other

## 2011-04-16 ENCOUNTER — Emergency Department (HOSPITAL_COMMUNITY)
Admission: EM | Admit: 2011-04-16 | Discharge: 2011-04-18 | Disposition: A | Discharge status: Other Acute Inpatient Hospital | Payer: Medicare Other | Attending: Emergency Medicine | Admitting: Emergency Medicine

## 2011-04-16 DIAGNOSIS — Z79899 Other long term (current) drug therapy: Secondary | ICD-10-CM | POA: Insufficient documentation

## 2011-04-16 DIAGNOSIS — R4182 Altered mental status, unspecified: Secondary | ICD-10-CM | POA: Insufficient documentation

## 2011-04-16 DIAGNOSIS — F209 Schizophrenia, unspecified: Secondary | ICD-10-CM | POA: Insufficient documentation

## 2011-04-16 LAB — COMPREHENSIVE METABOLIC PANEL
BUN: 8 mg/dL (ref 6–23)
Calcium: 8.9 mg/dL (ref 8.4–10.5)
GFR calc Af Amer: >60 mL/min (ref >60)
Glucose, Bld: 111 mg/dL — ABNORMAL HIGH (ref 70–99)
Total Protein: 6.5 g/dL (ref 6.0–8.3)

## 2011-04-16 LAB — DIFFERENTIAL
Eosinophils Absolute: 0.2 10*3/uL (ref 0.0–0.7)
Lymphocytes Relative: 24 % (ref 12–46)
Lymphs Abs: 1.7 10*3/uL (ref 0.7–4.0)
Neutro Abs: 4.7 10*3/uL (ref 1.7–7.7)
Neutrophils Relative %: 65 % (ref 43–77)

## 2011-04-16 LAB — POCT I-STAT TROPONIN I: Troponin i, poc: 0.02 ng/mL (ref 0.00–0.08)

## 2011-04-16 LAB — URINALYSIS, ROUTINE W REFLEX MICROSCOPIC
Bilirubin Urine: NEGATIVE
Glucose, UA: NEGATIVE mg/dL
Hgb urine dipstick: NEGATIVE
Specific Gravity, Urine: 1.009 (ref 1.005–1.030)
Urobilinogen, UA: 0.2 mg/dL (ref 0.0–1.0)

## 2011-04-16 LAB — CBC
Platelets: 166 10*3/uL (ref 150–400)
RBC: 3.99 MIL/uL — ABNORMAL LOW (ref 4.22–5.81)
WBC: 7.2 10*3/uL (ref 4.0–10.5)

## 2011-04-16 LAB — GLUCOSE, CAPILLARY: Glucose-Capillary: 102 mg/dL — ABNORMAL HIGH (ref 70–99)

## 2011-04-16 LAB — RAPID URINE DRUG SCREEN, HOSP PERFORMED
Amphetamines: NOT DETECTED
Cocaine: NOT DETECTED
Opiates: NOT DETECTED

## 2011-04-17 LAB — URINE CULTURE
Colony Count: NO GROWTH
Culture  Setup Time: 201208071237

## 2011-05-31 LAB — BASIC METABOLIC PANEL
Calcium: 9.4
GFR calc Af Amer: >60
GFR calc non Af Amer: >60
Potassium: 3.7
Sodium: 140

## 2011-05-31 LAB — URINALYSIS, ROUTINE W REFLEX MICROSCOPIC
Glucose, UA: NEGATIVE
Hgb urine dipstick: NEGATIVE
Ketones, ur: 15 — AB
Protein, ur: NEGATIVE
pH: 6

## 2011-05-31 LAB — URINE DRUGS OF ABUSE SCREEN W ALC, ROUTINE (REF LAB)
Creatinine,U: 292.8
Phencyclidine (PCP): NEGATIVE

## 2011-05-31 LAB — CBC
Hemoglobin: 13.6
MCHC: 35.6
Platelets: 197
RDW: 12.7

## 2011-05-31 LAB — TSH: TSH: 0.548

## 2011-05-31 LAB — HEPATIC FUNCTION PANEL
AST: 19
Bilirubin, Direct: 0.1
Indirect Bilirubin: 0.8

## 2011-06-07 LAB — SEDIMENTATION RATE: Sed Rate: 15

## 2011-06-07 LAB — CBC
Hemoglobin: 12.4 — ABNORMAL LOW
RDW: 12.8

## 2011-06-07 LAB — WOUND CULTURE

## 2011-06-07 LAB — HIV ANTIBODY (ROUTINE TESTING W REFLEX): HIV: NONREACTIVE

## 2011-06-07 LAB — CULTURE, BLOOD (ROUTINE X 2)
Culture: NO GROWTH
Culture: NO GROWTH

## 2011-06-07 LAB — DIFFERENTIAL
Basophils Absolute: 0.1
Lymphocytes Relative: 22
Monocytes Absolute: 0.8
Neutro Abs: 4.5
Neutrophils Relative %: 63

## 2011-06-07 LAB — BASIC METABOLIC PANEL
CO2: 29
Calcium: 8.4
Calcium: 8.6
Chloride: 102
Creatinine, Ser: 0.6
Creatinine, Ser: 0.62
GFR calc Af Amer: >60
GFR calc non Af Amer: >60
Glucose, Bld: 119 — ABNORMAL HIGH
Glucose, Bld: 91
Sodium: 132 — ABNORMAL LOW

## 2011-06-12 LAB — DIFFERENTIAL
Basophils Relative: 1
Eosinophils Absolute: 0
Monocytes Relative: 13 — ABNORMAL HIGH
Neutro Abs: 4.7
Neutrophils Relative %: 68

## 2011-06-12 LAB — URINALYSIS, ROUTINE W REFLEX MICROSCOPIC
Hgb urine dipstick: NEGATIVE
Nitrite: NEGATIVE
Protein, ur: NEGATIVE
Specific Gravity, Urine: 1.02
Urobilinogen, UA: 1

## 2011-06-12 LAB — CBC
HCT: 36.1 — ABNORMAL LOW
Hemoglobin: 12.3 — ABNORMAL LOW
MCHC: 34.1
RBC: 3.66 — ABNORMAL LOW
RDW: 12.7

## 2011-06-12 LAB — COMPREHENSIVE METABOLIC PANEL
ALT: 28
Alkaline Phosphatase: 99
BUN: 9
CO2: 25
Calcium: 9
GFR calc non Af Amer: >60
Glucose, Bld: 96
Potassium: 3.5
Total Protein: 5.9 — ABNORMAL LOW

## 2011-06-12 LAB — RAPID URINE DRUG SCREEN, HOSP PERFORMED
Barbiturates: NOT DETECTED
Opiates: NOT DETECTED

## 2011-06-13 LAB — RAPID URINE DRUG SCREEN, HOSP PERFORMED
Amphetamines: NOT DETECTED
Barbiturates: NOT DETECTED
Benzodiazepines: NOT DETECTED
Cocaine: NOT DETECTED
Opiates: NOT DETECTED

## 2011-06-13 LAB — URINALYSIS, ROUTINE W REFLEX MICROSCOPIC
Bilirubin Urine: NEGATIVE
Glucose, UA: NEGATIVE mg/dL
Hgb urine dipstick: NEGATIVE
Protein, ur: NEGATIVE mg/dL
Urobilinogen, UA: 0.2 mg/dL (ref 0.0–1.0)

## 2011-06-13 LAB — CBC
HCT: 34.7 % — ABNORMAL LOW (ref 39.0–52.0)
MCHC: 34.2 g/dL (ref 30.0–36.0)
MCV: 97.8 fL (ref 78.0–100.0)
Platelets: 161 10*3/uL (ref 150–400)
RBC: 3.55 MIL/uL — ABNORMAL LOW (ref 4.22–5.81)
WBC: 6.4 10*3/uL (ref 4.0–10.5)

## 2011-06-13 LAB — BASIC METABOLIC PANEL
BUN: 12 mg/dL (ref 6–23)
CO2: 26 mEq/L (ref 19–32)
Chloride: 105 mEq/L (ref 96–112)
Potassium: 3.3 mEq/L — ABNORMAL LOW (ref 3.5–5.1)

## 2011-06-13 LAB — GLUCOSE, CAPILLARY

## 2011-06-13 LAB — ETHANOL: Alcohol, Ethyl (B): <5 mg/dL (ref 0–10)

## 2011-06-18 LAB — COMPREHENSIVE METABOLIC PANEL
BUN: 10
CO2: 27
Calcium: 9.4
Creatinine, Ser: 0.67
GFR calc non Af Amer: >60
Glucose, Bld: 105 — ABNORMAL HIGH
Sodium: 137
Total Protein: 6.3

## 2011-06-18 LAB — DRUGS OF ABUSE SCREEN W/O ALC, ROUTINE URINE
Amphetamine Screen, Ur: NEGATIVE
Barbiturate Quant, Ur: NEGATIVE
Creatinine,U: 103.5
Methadone: NEGATIVE
Opiate Screen, Urine: NEGATIVE

## 2011-06-18 LAB — CBC
HCT: 40.5
Hemoglobin: 13.6
MCHC: 33.6
MCV: 94.9
RDW: 12.3

## 2011-06-27 ENCOUNTER — Emergency Department (HOSPITAL_COMMUNITY)
Admission: EM | Admit: 2011-06-27 | Discharge: 2011-06-28 | Disposition: A | Discharge status: Other Acute Inpatient Hospital | Payer: 59 | Source: Home / Self Care | Attending: Emergency Medicine | Admitting: Emergency Medicine

## 2011-06-27 DIAGNOSIS — F411 Generalized anxiety disorder: Secondary | ICD-10-CM | POA: Insufficient documentation

## 2011-06-27 DIAGNOSIS — F209 Schizophrenia, unspecified: Secondary | ICD-10-CM | POA: Insufficient documentation

## 2011-06-27 DIAGNOSIS — R000 Tachycardia, unspecified: Secondary | ICD-10-CM | POA: Insufficient documentation

## 2011-06-27 DIAGNOSIS — F17200 Nicotine dependence, unspecified, uncomplicated: Secondary | ICD-10-CM | POA: Insufficient documentation

## 2011-06-27 LAB — CBC
Hemoglobin: 15 g/dL (ref 13.0–17.0)
MCH: 33.5 pg (ref 26.0–34.0)
MCV: 92.6 fL (ref 78.0–100.0)
RBC: 4.48 MIL/uL (ref 4.22–5.81)
WBC: 11.3 10*3/uL — ABNORMAL HIGH (ref 4.0–10.5)

## 2011-06-27 LAB — URINALYSIS, ROUTINE W REFLEX MICROSCOPIC
Bilirubin Urine: NEGATIVE
Glucose, UA: NEGATIVE mg/dL
Ketones, ur: NEGATIVE mg/dL
Leukocytes, UA: NEGATIVE
Nitrite: NEGATIVE
Specific Gravity, Urine: 1.004 — ABNORMAL LOW (ref 1.005–1.030)
pH: 7 (ref 5.0–8.0)

## 2011-06-27 LAB — DIFFERENTIAL
Lymphocytes Relative: 17 % (ref 12–46)
Lymphs Abs: 2 10*3/uL (ref 0.7–4.0)
Monocytes Relative: 10 % (ref 3–12)
Neutro Abs: 7.9 10*3/uL — ABNORMAL HIGH (ref 1.7–7.7)
Neutrophils Relative %: 70 % (ref 43–77)

## 2011-06-27 LAB — BASIC METABOLIC PANEL
BUN: 10 mg/dL (ref 6–23)
CO2: 23 mEq/L (ref 19–32)
Chloride: 100 mEq/L (ref 96–112)
Creatinine, Ser: 0.53 mg/dL (ref 0.50–1.35)
GFR calc Af Amer: >90 mL/min (ref >90)
Potassium: 3.7 mEq/L (ref 3.5–5.1)

## 2011-06-27 LAB — RAPID URINE DRUG SCREEN, HOSP PERFORMED
Amphetamines: NOT DETECTED
Benzodiazepines: NOT DETECTED
Cocaine: NOT DETECTED

## 2011-06-28 ENCOUNTER — Inpatient Hospital Stay (HOSPITAL_COMMUNITY)
Admission: RE | Admit: 2011-06-28 | Discharge: 2011-07-01 | DRG: 885 | Disposition: A | Discharge status: Home / Self Care | Payer: 59 | Source: Ambulatory Visit | Attending: Psychiatry | Admitting: Psychiatry

## 2011-06-28 DIAGNOSIS — F17200 Nicotine dependence, unspecified, uncomplicated: Secondary | ICD-10-CM

## 2011-06-28 DIAGNOSIS — Z6379 Other stressful life events affecting family and household: Secondary | ICD-10-CM

## 2011-06-28 DIAGNOSIS — F200 Paranoid schizophrenia: Principal | ICD-10-CM

## 2011-06-28 DIAGNOSIS — Z818 Family history of other mental and behavioral disorders: Secondary | ICD-10-CM

## 2011-06-28 DIAGNOSIS — R000 Tachycardia, unspecified: Secondary | ICD-10-CM

## 2011-06-28 DIAGNOSIS — F331 Major depressive disorder, recurrent, moderate: Secondary | ICD-10-CM

## 2011-06-28 DIAGNOSIS — F339 Major depressive disorder, recurrent, unspecified: Secondary | ICD-10-CM

## 2011-06-28 DIAGNOSIS — Z609 Problem related to social environment, unspecified: Secondary | ICD-10-CM
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
## 2011-07-31 ENCOUNTER — Other Ambulatory Visit (HOSPITAL_COMMUNITY): Payer: Self-pay

## 2011-08-05 ENCOUNTER — Encounter (HOSPITAL_COMMUNITY): Payer: Self-pay | Admitting: Physician Assistant

## 2011-08-05 ENCOUNTER — Ambulatory Visit (INDEPENDENT_AMBULATORY_CARE_PROVIDER_SITE_OTHER): Payer: Medicare Other | Admitting: Physician Assistant

## 2011-08-05 VITALS — BP 132/82 | HR 90 | Ht 73.0 in | Wt 184.6 lb

## 2011-08-05 DIAGNOSIS — F200 Paranoid schizophrenia: Secondary | ICD-10-CM

## 2011-08-05 MED ORDER — MIRTAZAPINE 15 MG PO TABS: 15.0000 mg | ORAL_TABLET | Freq: Every day | ORAL | Status: DC

## 2011-08-05 MED ORDER — HALOPERIDOL 5 MG PO TABS: 5.0000 mg | ORAL_TABLET | Freq: Every day | ORAL | Status: DC

## 2011-08-05 MED ORDER — CHLORPROMAZINE HCL 50 MG PO TABS: 50.0000 mg | ORAL_TABLET | Freq: Two times a day (BID) | ORAL | Status: DC

## 2011-08-05 MED ORDER — TRAZODONE 25 MG HALF TABLET: 50.0000 mg | ORAL_TABLET | Freq: Every day | ORAL | Status: DC

## 2011-08-05 NOTE — Patient Instructions (Signed)
Pt. Will arrange and establish care with local PCP for routine family care. Pt. Will continue his medications as ordered. Pt. Will try to decrease his tobacco use from 2 packs per day down to 1.5 packs per day. He will establish a regular schedule for sleep and exercise. He will also continue his out patient therapy as noted. He will follow up with this office in 6-8 weeks.

## 2011-08-05 NOTE — Progress Notes (Signed)
Psychiatric Assessment Adult  Patient Identification:  [name] Date of Evaluation:  08/05/2011 Chief Complaint: Establish care History of Chief Complaint:   Chief Complaint  Patient presents with  . Establish Care    HPI : The patient was admitted to [facility] on 06/26/2011 complaining of increasing social isolation. The patient noted that he was sitting alone in his apartment smoking cigarettes all day up to 3 packs a day drinking excessive amounts of coffee and pacing back and forth and going to the local diner. He stated he had no future nothing to live for. Patient was discharged on October 24 and advised to followup with me today. Review of Systems patient's current symptoms are negative for suicidal ideation, homicidal ideation, auditory or visual hallucinations, or delusions. Physical Exam  Depressive Symptoms: Current depressive symptoms are anhedonia mild loss of interest with some isolation. He also notes poor sleep with some appetite changes.  (Hypo) Manic Symptoms:   Elevated Mood:  No Irritable Mood:  No Grandiosity:  No Distractibility:  No Labiality of Mood:  No Delusions:  No Hallucinations:  No Impulsivity:  No Sexually Inappropriate Behavior:  No Financial Extravagance:  No Flight of Ideas:  No  Anxiety Symptoms: Excessive Worry:  No Panic Symptoms:  No Agoraphobia:  No Obsessive Compulsive: No  Symptoms: None Specific Phobias:  No Social Anxiety:  No  Psychotic Symptoms:  Hallucinations: No Auditory Tactile Visual Delusions:  No Paranoia:  Yes   Ideas of Reference:  No  PTSD Symptoms: Ever had a traumatic exposure:  No Had a traumatic exposure in the last month:  No Re-experiencing: No Intrusive Thoughts None Hypervigilance:  No Hyperarousal: No None Avoidance: No Decreased Interest/Participation  Traumatic Brain Injury: No   Past Psychiatric History: Diagnosis: Paranoid schizophrenia   Hospitalizations: Multiple     Outpatient Care: Consistent   Substance Abuse Care: Past history of rehabilitation   Self-Mutilation: None   Suicidal Attempts: None   Violent Behaviors: None    Past Medical History:   Past Medical History  Diagnosis Date  . Cancer   . Diabetes mellitus type II   . Substance abuse in remission    History of Loss of Consciousness:  No Seizure History:  No Cardiac History:  Yes Allergies:  Allergies not on file Current Medications:  Current Outpatient Prescriptions  Medication Sig Dispense Refill  . chlorproMAZINE (THORAZINE) 50 MG tablet Take 1 tablet (50 mg total) by mouth 2 (two) times daily.  60 tablet  1  . haloperidol (HALDOL) 5 MG tablet Take 1 tablet (5 mg total) by mouth at bedtime.  30 tablet  1  . mirtazapine (REMERON) 15 MG tablet Take 1 tablet (15 mg total) by mouth at bedtime.  30 tablet  1  . traZODone (DESYREL) 25 mg TABS Take 1 tablet (50 mg total) by mouth at bedtime.  30 tablet  1    Previous Psychotropic Medications:  Medication Dose  Seroquel     Risperdal                    Substance Abuse History in the last 12 months: Substance Heart murmur  Age of 1st Use Last Use Amount Specific Type  Nicotine  3 packs per day      Alcohol           Cannabis         Opiates      Cocaine      Methamphetamines  LSD      Ecstasy      Benzodiazepines      Caffeine      Inhalants      Others:                          Medical Consequences of Substance Abuse:  egal Consequences of Substance Abuse:  amily Consequences of Substance Abuse:   Blackouts:  No DT's:  No Withdrawal Symptoms:  No None  Social History: Current Place of Residence: [place] [address] of Birth: [place]  Family Members: 3 brothers  Marital Status:  Divorced Children: Two   Sons: 2  Daughters:  Relationships:  Education:  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 Educational Problems/Performance:  None Religious Beliefs/Practices: Catholic History of Abuse:  none Teacher, music History:   Legal History: Negative Hobbies/Interests: None  Family History:   Family History  Problem Relation Age of Onset  . Bipolar disorder Brother   . Drug abuse Brother   . Drug abuse Brother   . Bipolar disorder Brother     Mental Status Examination/Evaluation: Objective:  Appearance: Fairly Groomed  Patent attorney::  Good  Speech:  Clear and Coherent  Volume:  Normal  Mood:  Mildly anxious  Affect:  Flat  Thought Process:  Linear  Orientation:  Full  Thought Content:  Ideas of Reference:   Paranoia  Suicidal Thoughts:  No  Homicidal Thoughts:  No  Judgement:  Intact  Insight:  Good  Psychomotor Activity:  Increased  Akathisia:  No  Handed:  Left  AIMS (if indicated):  Pt has mild tardive dyskenesia  Assets:  Communication Skills Desire for Improvement Financial Resources/Insurance Housing Resilience Talents/Skills Transportation    Laboratory/X-Ray Psychological Evaluation(s)        Assessment:  Chronic paranoid schizophrenia  AXIS I Chronic Paranoid Schizophrenia  AXIS II No diagnosis  AXIS III Past Medical History  Diagnosis Date  . Cancer   . Diabetes mellitus type II   . Substance abuse in remission      AXIS IV problems with primary support group  AXIS V 51-60 moderate symptoms   Treatment Plan/Recommendations:  Plan of Care: Patient's medication will be continued as written. Patient will start trazodone 50 mg by mouth at at bedtime #30 with one refill. Patient will continue his Thorazine 50 mg 1 by mouth twice a day as written. The patient will continue his haloperidol 5 mg by mouth at bedtime as written. He will also continue his mirtazapine 15 mg by mouth at at bedtime. The patient will develop and set a regular schedule for sleep and exercise. Patient will cut down on his caffeine intake from approximately 9 servings a day to 4 or 5  servings a day. He will followup in 2 months.   Laboratory:  Laboratory results will be ordered on his next visit.  Psychotherapy: Patient will be established and has an appointment tomorrow with Lucilla Lame for outpatient therapy.   Medications: Refilled for 2 months  Routine PRN Medications:  No  Consultations: Patient is strongly encouraged to become established with local primary care family physician for routine medical care.   Safety Concerns:  Patient is strongly encouraged to cut down his smoking from 2 packs a day to one and a half packs a day.   Other:      Alianny Toelle, PA 11/26/20121:33 PM

## 2011-08-06 ENCOUNTER — Ambulatory Visit (INDEPENDENT_AMBULATORY_CARE_PROVIDER_SITE_OTHER): Payer: Medicare Other | Admitting: Psychology

## 2011-08-06 DIAGNOSIS — F200 Paranoid schizophrenia: Secondary | ICD-10-CM

## 2011-08-06 NOTE — Progress Notes (Signed)
   THERAPIST PROGRESS NOTE  Session Time: 1130- 1230 Participation Level: Active  Behavioral Response: Fairly GroomedAlertDepressed  Type of Therapy: Individual Therapy  Treatment Goals addressed: Coping  Interventions: Solution Focused  Summary: [name] is a 55 y.o. 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  After he caught me up on his recent history, we focused on prior, present and potential coping skills.  He describes problems with boredom, difficulty sustaining attention for more than 15-30 min. At a time, trouble learning new skills, and a deficiency of social contacts and outlets.  We reviewed what he has tried in the past (volunteering at [employer] or [employer], yoga classes) and the problems that arose there.  He has been recently thinking about joining the [facility] for physical activity and taking guitar lessons or a yoga class.  We reviewed the barriers to doing these and problem-solved ways around the barriers.  He demonstrates good insight and knowledge about his disease.  He describes improvement in his symptoms since most recent hospital stay, so that he now has illusions rather than hallucinations and the auditory commentary is recognized as being within his own head.  He denies feeling particularly paranoid, and recognizes the value of medication to maintain control of symptoms.  We set goals for our work together:   1.  Avoid inpatient hospital stay this coming year by staying on his medication.   2.  Find a physical outlet that he enjoys and that is suitable to his age and physical condition, and then practice it regularly.   3.  Locate a volunteer activity that is engaging and gives him a sense of contributing to others.   4. Through the above, to grow more social contacts that may lead to friendship.  My role will be to act as coach and to hold him accountable for what he  says he will do.   Suicidal/Homicidal: Nowithout intent/plan  Therapist Response: NA  Plan: Return again in Next available appt.  Diagnosis: Axis I: Chronic Paranoid Schizophrenia    Axis II: Deferred    Khali Perella, RN 08/06/2011

## 2011-09-12 ENCOUNTER — Ambulatory Visit (HOSPITAL_COMMUNITY): Payer: Medicare Other | Admitting: Psychology

## 2011-10-07 ENCOUNTER — Ambulatory Visit (INDEPENDENT_AMBULATORY_CARE_PROVIDER_SITE_OTHER): Payer: Medicare Other | Admitting: Physician Assistant

## 2011-10-07 ENCOUNTER — Encounter (HOSPITAL_COMMUNITY): Payer: Self-pay | Admitting: Physician Assistant

## 2011-10-07 DIAGNOSIS — F200 Paranoid schizophrenia: Secondary | ICD-10-CM
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
## 2011-10-19 ENCOUNTER — Other Ambulatory Visit (HOSPITAL_COMMUNITY): Payer: Self-pay | Admitting: Physician Assistant

## 2011-10-19 DIAGNOSIS — F200 Paranoid schizophrenia: Secondary | ICD-10-CM

## 2011-10-24 ENCOUNTER — Ambulatory Visit (HOSPITAL_COMMUNITY): Payer: Medicare Other | Admitting: Psychology

## 2011-11-07 ENCOUNTER — Ambulatory Visit (INDEPENDENT_AMBULATORY_CARE_PROVIDER_SITE_OTHER): Payer: Medicare Other | Admitting: Physician Assistant

## 2011-11-07 DIAGNOSIS — F200 Paranoid schizophrenia: Secondary | ICD-10-CM

## 2011-11-07 MED ORDER — BENZTROPINE MESYLATE 1 MG PO TABS: 1.0000 mg | ORAL_TABLET | Freq: Two times a day (BID) | ORAL | Status: DC

## 2011-11-07 MED ORDER — HALOPERIDOL 5 MG PO TABS: 5.0000 mg | ORAL_TABLET | Freq: Every day | ORAL | Status: DC

## 2011-11-07 MED ORDER — CHLORPROMAZINE HCL 50 MG PO TABS: 100.0000 mg | ORAL_TABLET | Freq: Two times a day (BID) | ORAL | Status: DC

## 2011-11-07 MED ORDER — MIRTAZAPINE 15 MG PO TABS: 15.0000 mg | ORAL_TABLET | Freq: Every day | ORAL | Status: DC

## 2011-11-07 NOTE — Progress Notes (Signed)
   [facility] Behavioral Health Follow-up Outpatient Visit  PERLE GIBBON [birth year]  Date: 11/07/11   Subjective: Lyndell presents today to followup on his medications prescribed for her schizophrenia. He reports that he had a relapse on cocaine for a period of about 2 weeks, and has been clean now for about 2-1/2 weeks. During this relapse he had psychiatric decompensation, and developed financial difficulties. He reports that his medication makes him feel like he is fighting a Immunologist. He reports that he is not sleeping well at night, and is experiencing side effects to his medication. He is currently having acasthesia involving his tongue and mouth. He denies any current paranoid delusions, suicidal or homicidal ideation, or auditory or visual hallucinations.  There were no vitals filed for this visit.  Mental Status Examination  Appearance: Disheveled Alert: Yes Attention: good  Cooperative: Yes Eye Contact: Fair Speech: Clear and even Psychomotor Activity: Increased Memory/Concentration: Memory impaired/concentration intact. Oriented: person, place, time/date and situation Mood: Euthymic Affect: Flat Thought Processes and Associations: Circumstantial, Disorganized, Goal Directed and Logical Fund of Knowledge: Good Thought Content:  Insight: Fair Judgement: Fair  Diagnosis: Schizophrenia, paranoid type  Treatment Plan: We will add Cogentin 1 mg twice daily, increase his Thorazine to 100 mg twice daily. Continue Haldol 5 mg at bedtime, and Remeron 15 mg at bedtime. He will return for followup in one month.  Trisha Ken, PA

## 2011-11-15 ENCOUNTER — Ambulatory Visit (HOSPITAL_COMMUNITY): Payer: Self-pay | Admitting: Psychology

## 2011-12-02 ENCOUNTER — Ambulatory Visit (INDEPENDENT_AMBULATORY_CARE_PROVIDER_SITE_OTHER): Payer: Self-pay | Admitting: Physician Assistant

## 2011-12-02 DIAGNOSIS — F200 Paranoid schizophrenia: Secondary | ICD-10-CM

## 2011-12-02 MED ORDER — MIRTAZAPINE 15 MG PO TABS: 15.0000 mg | ORAL_TABLET | Freq: Every day | ORAL | Status: DC

## 2011-12-02 MED ORDER — HALOPERIDOL 5 MG PO TABS: 5.0000 mg | ORAL_TABLET | Freq: Every day | ORAL | Status: DC

## 2011-12-02 NOTE — Progress Notes (Signed)
   [facility] Follow-up Outpatient Visit  [name] [birth year]  Date: 12/02/11   Subjective: [name] presents today to followup on his medications prescribed for schizophrenia. He reports that his dry mouth and swollen tongue have improved. He started back on the Cogentin and is taking that at 1 mg twice daily. He also reports that he is taking the Haldol and the Thorazine only once daily usually in the evening and then takes Remeron at bedtime. He states that his tics have improved and his attention span has gotten better. He is attempting to cut down on his smoking and has decreased from 2 packs daily to one pack daily. 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 He feels that it would be a good piece of property for an assisted living facility. He denies any cravings for cocaine, and in fact reasons the people who he bought the cocaine from. He denies any current auditory or visual hallucinations. He denies any suicidal or homicidal ideations.  There were no vitals filed for this visit.  Mental Status Examination  Appearance: Well groomed and casually dressed Alert: Yes Attention: good  Cooperative: Yes Eye Contact: Fair Speech: Clear and even Psychomotor Activity: Normal Memory/Concentration: Intact Oriented: person, place, time/date and situation Mood: Euthymic Affect: Flat Thought Processes and Associations: Goal Directed and Logical Fund of Knowledge: Good Thought Content: Normal Insight: Fair Judgement: Good  Diagnosis: Schizophrenia, paranoid type  Treatment Plan: We will continue his Haldol at 5 mg each evening, Thorazine 100 mg each evening, and Remeron at 15 mg at bedtime. Also we will continue his Cogentin at 1 mg twice daily. He will return for followup in one month.  Fallyn Munnerlyn, PA-C

## 2012-01-08 ENCOUNTER — Ambulatory Visit (HOSPITAL_COMMUNITY): Payer: Self-pay | Admitting: Physician Assistant

## 2012-01-10 ENCOUNTER — Other Ambulatory Visit (HOSPITAL_COMMUNITY): Payer: Self-pay | Admitting: Physician Assistant

## 2012-01-11 ENCOUNTER — Other Ambulatory Visit (HOSPITAL_COMMUNITY): Payer: Self-pay | Admitting: Physician Assistant

## 2012-02-03 ENCOUNTER — Other Ambulatory Visit (HOSPITAL_COMMUNITY): Payer: Self-pay | Admitting: Physician Assistant

## 2012-02-10 ENCOUNTER — Other Ambulatory Visit (HOSPITAL_COMMUNITY): Payer: Self-pay | Admitting: Physician Assistant

## 2012-02-15 ENCOUNTER — Emergency Department (HOSPITAL_COMMUNITY): Payer: Medicare Other

## 2012-02-15 ENCOUNTER — Encounter (HOSPITAL_COMMUNITY): Payer: Self-pay

## 2012-02-15 ENCOUNTER — Emergency Department (HOSPITAL_COMMUNITY)
Admission: EM | Admit: 2012-02-15 | Discharge: 2012-02-15 | Disposition: A | Discharge status: Home / Self Care | Payer: Medicare Other | Attending: Emergency Medicine | Admitting: Emergency Medicine

## 2012-02-15 DIAGNOSIS — R1011 Right upper quadrant pain: Secondary | ICD-10-CM | POA: Insufficient documentation

## 2012-02-15 DIAGNOSIS — E119 Type 2 diabetes mellitus without complications: Secondary | ICD-10-CM | POA: Insufficient documentation

## 2012-02-15 DIAGNOSIS — M549 Dorsalgia, unspecified: Secondary | ICD-10-CM | POA: Insufficient documentation

## 2012-02-15 DIAGNOSIS — F17200 Nicotine dependence, unspecified, uncomplicated: Secondary | ICD-10-CM | POA: Insufficient documentation

## 2012-02-15 DIAGNOSIS — M25519 Pain in unspecified shoulder: Secondary | ICD-10-CM

## 2012-02-15 LAB — CBC
HCT: 37.2 % — ABNORMAL LOW (ref 39.0–52.0)
MCHC: 35.8 g/dL (ref 30.0–36.0)
MCV: 90.7 fL (ref 78.0–100.0)
Platelets: 194 10*3/uL (ref 150–400)
RDW: 12.3 % (ref 11.5–15.5)
WBC: 10.3 10*3/uL (ref 4.0–10.5)

## 2012-02-15 LAB — COMPREHENSIVE METABOLIC PANEL
ALT: 11 U/L (ref 0–53)
Albumin: 3.8 g/dL (ref 3.5–5.2)
Alkaline Phosphatase: 112 U/L (ref 39–117)
BUN: 9 mg/dL (ref 6–23)
Calcium: 8.9 mg/dL (ref 8.4–10.5)
Potassium: 3.7 mEq/L (ref 3.5–5.1)
Sodium: 132 mEq/L — ABNORMAL LOW (ref 135–145)
Total Protein: 7.3 g/dL (ref 6.0–8.3)

## 2012-02-15 LAB — DIFFERENTIAL
Basophils Absolute: 0.1 10*3/uL (ref 0.0–0.1)
Basophils Relative: 1 % (ref 0–1)
Eosinophils Absolute: 0.3 10*3/uL (ref 0.0–0.7)
Eosinophils Relative: 2 % (ref 0–5)
Monocytes Absolute: 0.9 10*3/uL (ref 0.1–1.0)

## 2012-02-15 LAB — LIPASE, BLOOD: Lipase: 21 U/L (ref 11–59)

## 2012-02-15 MED ORDER — KETOROLAC TROMETHAMINE 30 MG/ML IJ SOLN
30.0000 mg | Freq: Once | INTRAMUSCULAR | Status: AC
  Administered 2012-02-15: 30 mg via INTRAVENOUS
  Filled 2012-02-15: qty 1

## 2012-02-15 MED ORDER — MORPHINE SULFATE 4 MG/ML IJ SOLN
4.0000 mg | Freq: Once | INTRAMUSCULAR | Status: AC
  Administered 2012-02-15: 4 mg via INTRAVENOUS
  Filled 2012-02-15: qty 1

## 2012-02-15 MED ORDER — TRAMADOL HCL 50 MG PO TABS: 50.0000 mg | ORAL_TABLET | Freq: Four times a day (QID) | ORAL | Status: AC | PRN

## 2012-02-15 MED ORDER — NAPROXEN 500 MG PO TABS: 500.0000 mg | ORAL_TABLET | Freq: Two times a day (BID) | ORAL | Status: DC

## 2012-02-15 NOTE — ED Provider Notes (Signed)
History     CSN: [id number]  Arrival date & time 02/15/12  1435   First MD Initiated Contact with Patient 02/15/12 1516      Chief Complaint  Patient presents with  . Shoulder Pain  . Back Pain    (Consider location/radiation/quality/duration/timing/severity/associated sxs/prior treatment) HPI Comments: Patient presents with multiple complaints. He presents with right shoulder, arm, back pain has been present for one week. He denies a known injury however states that he noticed it the day after he reached high and a CABG replace an object. He also states she's had right upper quadrant abdominal pain for similar period time. Denies nausea, vomiting. No exacerbating or alleviating measures other than moving the arm. There is nothing that makes his abdominal pain worse. He denies chest pain, shortness of breath, nausea, vomiting.  The history is provided by the patient. No language interpreter was used.    Past Medical History  Diagnosis Date  . Cancer   . Diabetes mellitus type II   . Substance abuse in remission     Past Surgical History  Procedure Date  . Lumbar disc surgery     Family History  Problem Relation Age of Onset  . Bipolar disorder Brother   . Drug abuse Brother   . Drug abuse Brother   . Bipolar disorder Brother     History  Substance Use Topics  . Smoking status: Current Everyday Smoker -- 2.0 packs/day for 12 years    Types: Cigarettes  . Smokeless tobacco: Not on file  . Alcohol Use: No      Review of Systems  Constitutional: Negative for fever, chills, activity change, appetite change and fatigue.  HENT: Negative for congestion, sore throat, rhinorrhea, neck pain and neck stiffness.   Respiratory: Negative for cough, chest tightness and shortness of breath.   Cardiovascular: Negative for chest pain and palpitations.  Gastrointestinal: Positive for abdominal pain. Negative for nausea and vomiting.  Genitourinary: Negative for dysuria, urgency,  frequency and flank pain.  Musculoskeletal: Positive for myalgias and arthralgias. Negative for back pain.  Skin: Negative for color change, pallor and wound.  Neurological: Negative for dizziness, weakness, light-headedness, numbness and headaches.  All other systems reviewed and are negative.    Allergies  Review of patient's allergies indicates no known allergies.  Home Medications   Current Outpatient Rx  Name Route Sig Dispense Refill  . BENZTROPINE MESYLATE 1 MG PO TABS  TAKE 1 TABLET (1 MG TOTAL) BY MOUTH 2 (TWO) TIMES DAILY. 60 tablet 0  . CHLORPROMAZINE HCL 50 MG PO TABS      . HALOPERIDOL 5 MG PO TABS  TAKE 1 TABLET BY MOUTH AT BEDTIME 30 tablet 0  . MIRTAZAPINE 15 MG PO TABS  TAKE 1 TABLET BY MOUTH AT BEDTIME 30 tablet 0  . NAPROXEN 250 MG PO TABS Oral Take 250 mg by mouth daily as needed. Pain.    Marland Kitchen NAPROXEN 500 MG PO TABS Oral Take 1 tablet (500 mg total) by mouth 2 (two) times daily. 30 tablet 0  . TRAMADOL HCL 50 MG PO TABS Oral Take 1 tablet (50 mg total) by mouth every 6 (six) hours as needed for pain. 15 tablet 0    BP 135/85  Pulse 110  Temp(Src) 98.7 F (37.1 C) (Oral)  Resp 22  SpO2 98%  Physical Exam  Nursing note and vitals reviewed. Constitutional: He is oriented to person, place, and time. He appears well-developed and well-nourished. No distress.  HENT:  Head: Normocephalic and atraumatic.  Mouth/Throat: Oropharynx is clear and moist.  Eyes: Conjunctivae and EOM are normal. Pupils are equal, round, and reactive to light.  Neck: Normal range of motion. Neck supple.       Negative sperlings test bilaterally  Cardiovascular: Normal rate, regular rhythm, normal heart sounds and intact distal pulses.  Exam reveals no gallop and no friction rub.   No murmur heard. Pulmonary/Chest: Effort normal and breath sounds normal. No respiratory distress. He exhibits no tenderness.  Abdominal: Soft. Bowel sounds are normal. There is tenderness (RUQ tenderness with  negative murphy sign). There is no rebound and no guarding.  Musculoskeletal:       Right shoulder: He exhibits decreased range of motion, tenderness and pain. He exhibits no bony tenderness, no swelling, no deformity and no spasm.       Arms: Neurological: He is alert and oriented to person, place, and time. He has normal strength. No cranial nerve deficit or sensory deficit.  Skin: Skin is warm and dry. No rash noted.    ED Course  Procedures (including critical care time)  Labs Reviewed  CBC - Abnormal; Notable for the following:    RBC 4.10 (*)    HCT 37.2 (*)    All other components within normal limits  COMPREHENSIVE METABOLIC PANEL - Abnormal; Notable for the following:    Sodium 132 (*)    Glucose, Bld 121 (*)    Total Bilirubin 0.2 (*)    All other components within normal limits  DIFFERENTIAL  LIPASE, BLOOD   Dg Cervical Spine Complete  02/15/2012  *RADIOLOGY REPORT*  Clinical Data: Neck pain, no known injury  CERVICAL SPINE - COMPLETE 4+ VIEW  Comparison: None.  Findings: Six views of cervical spine submitted.  No acute fracture or subluxation.  Mild anterior spurring noted upper endplate of the C6 vertebral body.  There is moderate disc space flattening with mild anterior spurring at C6-C7 level.  No prevertebral soft tissue swelling.  Cervical airway is patent.  No significant neural foraminal narrowing noted on oblique views.  IMPRESSION: No acute fracture or subluxation.  Degenerative changes as described above.  Original Report Authenticated By: Natasha Mead, M.D.   Dg Shoulder Right  02/15/2012  *RADIOLOGY REPORT*  Clinical Data: Shoulder pain, back pain  RIGHT SHOULDER - 2+ VIEW  Comparison: Right clavicle 12/24/2003  Findings: Three views of the right shoulder submitted.  No acute fracture or subluxation.  There is a healed fracture of the right clavicle.  IMPRESSION: No acute fracture or subluxation.  Old healed fracture of the right clavicle.  Original Report Authenticated  By: Natasha Mead, M.D.   US Abdomen Complete  02/15/2012  *RADIOLOGY REPORT*  Clinical Data:  Right upper quadrant pain,  ABDOMINAL ULTRASOUND COMPLETE  Comparison:  None.  Findings:  Gallbladder:  No gallstones, gallbladder wall thickening, or pericholecystic fluid. Negative sonographic Murphy's sign.  Common Bile Duct:  Within normal limits in caliber. Measures 5 mm.  Liver: No focal mass lesion identified.  Within normal limits in parenchymal echogenicity.  IVC:  Appears normal.  Pancreas:  Although the pancreas is difficult to visualize in its entirety, no focal pancreatic abnormality is identified.  Spleen:  Within normal limits in size and echotexture. Measures 6 cm sagittal length.  Right kidney:  Normal in size and parenchymal echogenicity.  No evidence of mass or hydronephrosis.  Left kidney:  Normal in size and parenchymal echogenicity.  No evidence of mass or hydronephrosis.  Abdominal Aorta:  No aneurysm identified. Atherosclerotic calcification is noted in the abdominal aorta.  IMPRESSION:  1.  Atherosclerotic changes of the normal caliber abdominal aorta. 2.  Otherwise, abdominal ultrasound within limits.  Original Report Authenticated By: Britta Mccreedy, M.D.     1. Shoulder pain       MDM  Shoulder pain likely secondary to muscular strain. He has no evidence of gallbladder disease although he had right upper quadrant pain on palpation. He'll be discharged home with pain medication and anti-inflammatory medication and instructed to followup with primary care physician in one week as needed.        Dayton Bailiff, MD 02/15/12 1743

## 2012-02-15 NOTE — Discharge Instructions (Signed)
Shoulder Pain The shoulder is a ball and socket joint. The muscles and tendons (rotator cuff) are what keep the shoulder in its joint and stable. This collection of muscles and tendons holds in the head (ball) of the humerus (upper arm bone) in the fossa (cup) of the scapula (shoulder blade). Today no reason was found for your shoulder pain. Often pain in the shoulder may be treated conservatively with temporary immobilization. For example, holding the shoulder in one place using a sling for rest. Physical therapy may be needed if problems continue. HOME CARE INSTRUCTIONS   Apply ice to the sore area for 15 to 20 minutes, 3 to 4 times per day for the first 2 days. Put the ice in a plastic bag. Place a towel between the bag of ice and your skin.   If you have or were given a shoulder sling and straps, do not remove for as long as directed by your caregiver or until you see a caregiver for a follow-up examination. If you need to remove it to shower or bathe, move your arm as little as possible.   Sleep on several pillows at night to lessen swelling and pain.   Only take over-the-counter or prescription medicines for pain, discomfort, or fever as directed by your caregiver.   Keep any follow-up appointments in order to avoid any type of permanent shoulder disability or chronic pain problems.  SEEK MEDICAL CARE IF:   Pain in your shoulder increases or new pain develops in your arm, hand, or fingers.   Your hand or fingers are colder than your other hand.   You do not obtain pain relief with the medications or your pain becomes worse.  SEEK IMMEDIATE MEDICAL CARE IF:   Your arm, hand, or fingers are numb or tingling.   Your arm, hand, or fingers are swollen, painful, or turn white or blue.   You develop chest pain or shortness of breath.  MAKE SURE YOU:   Understand these instructions.   Will watch your condition.   Will get help right away if you are not doing well or get worse.    Document Released: 06/05/2005 Document Revised: 08/15/2011 Document Reviewed: 08/10/2011 [employer] Patient Information 2012 [employer]. 

## 2012-02-15 NOTE — ED Notes (Signed)
Pt c/o right shoulder pain x1 week.  Pt states pain began shortly after he lifted a half gallon of bleach "way up high" onto a shelf.  Pt states pain is under his right arm radiating into the shoulder.  Pt states he occasionally has pain in right arm as well as some tingling into his arm and hand, but denies numbness in extremity.  Pt denies neck pain.

## 2012-02-15 NOTE — ED Notes (Signed)
Pt in from home with right shoulder/arm/back pain x1 week denies recent injury states occurred when he was reaching for something states pain radiates through armpit and down the arm

## 2012-02-20 ENCOUNTER — Encounter (HOSPITAL_COMMUNITY): Payer: Self-pay | Admitting: Physician Assistant

## 2012-02-20 ENCOUNTER — Ambulatory Visit (INDEPENDENT_AMBULATORY_CARE_PROVIDER_SITE_OTHER): Payer: Medicare Other | Admitting: Physician Assistant

## 2012-02-20 DIAGNOSIS — F200 Paranoid schizophrenia: Secondary | ICD-10-CM

## 2012-02-20 MED ORDER — CHLORPROMAZINE HCL 50 MG PO TABS: 150.0000 mg | ORAL_TABLET | Freq: Two times a day (BID) | ORAL | Status: DC

## 2012-02-20 NOTE — Progress Notes (Signed)
   [facility] Follow-up Outpatient Visit  [name] August 03, 1956  Date: 02/20/2012   Subjective: [name] presents today to followup on medications prescribed for schizophrenia. He reports that he feels good, and that his meds are working. He does complain that he has a dry mouth much of the time. We discussed possibly decreasing the dose of Cogentin to relieve that, but he feels that it is working well he does not want decrease it. When asked about sleep he states "I sleep okay, when I sleep." He reports that he goes to bed at 2 AM or later, then sleeps until about 8 or 9 AM. He endorses that on occasion he will stay awake for 24 hour period. He does not feel an increase in Remeron would help him to sleep. At those times he reports that he will go to a 24-hour diner where he may talk to himself. In discussing possible increase in medications he reports he cannot take more Haldol because "it boxes in my mind." He explains that he means that the Haldol limits his ability to function well cognitively. He states that his appetite is good and is eating a healthier diet. He denies any suicidal or homicidal ideations. He describes some very mild auditory hallucinations, but denies any visual hallucinations.  There were no vitals filed for this visit.  Mental Status Examination  Appearance: Well groomed and casually dressed Alert: Yes Attention: good  Cooperative: Yes Eye Contact: Good Speech: Clear and coherent Psychomotor Activity: Normal Memory/Concentration: Intact Oriented: person, place, time/date and situation Mood: Dysphoric Affect: Appropriate Thought Processes and Associations: Linear Fund of Knowledge: Good Thought Content: Auditory hallucinations Insight: Good Judgement: Good  Diagnosis: Schizophrenia, paranoid type  Treatment Plan: We will try increasing his Thorazine to 150 mg twice daily, and followup in 6 weeks. We will continue his Haldol 5 mg at bedtime,  and Cogentin 1 mg twice daily, and Remeron 15 mg at bedtime.  Jeanna Giuffre, PA-C

## 2012-02-25 ENCOUNTER — Other Ambulatory Visit (HOSPITAL_COMMUNITY): Payer: Self-pay | Admitting: Physician Assistant

## 2012-04-01 ENCOUNTER — Encounter: Payer: Self-pay | Admitting: Family Medicine

## 2012-04-01 ENCOUNTER — Ambulatory Visit (INDEPENDENT_AMBULATORY_CARE_PROVIDER_SITE_OTHER): Payer: Medicare Other | Admitting: Family Medicine

## 2012-04-01 VITALS — BP 138/90 | HR 88 | Temp 98.1°F | Ht 73.0 in | Wt 186.5 lb

## 2012-04-01 DIAGNOSIS — M792 Neuralgia and neuritis, unspecified: Secondary | ICD-10-CM | POA: Insufficient documentation

## 2012-04-01 DIAGNOSIS — IMO0002 Reserved for concepts with insufficient information to code with codable children: Secondary | ICD-10-CM

## 2012-04-01 MED ORDER — PREDNISONE 20 MG PO TABS: ORAL_TABLET | ORAL | Status: DC

## 2012-04-01 NOTE — Assessment & Plan Note (Signed)
spurling positive.  Restart prednisone, stop naproxen.  Failed home exercise/stretching.  1.5 months duration.  No weakness.  Refer for MR and then likely to spine clinic. He agrees. Requesting old records.  Steroid caution given.

## 2012-04-01 NOTE — Patient Instructions (Signed)
Stop the naproxen, restart prednisone, take with food.  See Shirlee Limerick about your referral before you leave today. Take care.

## 2012-04-01 NOTE — Progress Notes (Signed)
New patient.   ~1.5 months of R neck and radiation into the arm.  Worse with raising the arm up and placing arm on a counter.  No injury.  Electrical sensation down the arm to the 4th and 5th finger.  No L sided sx.  No FCNAVD.  Dry mouth at baseline from meds.  His R hand is weaker per his report.  No leg sx.  No rash.  Was given prednisone and felt better but then sx returned as bad a prev.  Tramadol didn't help prev.  Naproxen doesn't help.  Failed home stretching program.   PMH and SH reviewed  ROS: See HPI, otherwise noncontributory.  Meds, vitals, and allergies reviewed.   nad ncat Mmm Neck supple, pain with forward flexion but no midline pain Normal ROM at the shoulders, elbows and wrists No impingement on R shoulder No weakness on exam in the R arm, hand.  spurling positive  DTR intact

## 2012-04-04 ENCOUNTER — Ambulatory Visit
Admission: RE | Admit: 2012-04-04 | Discharge: 2012-04-04 | Disposition: A | Discharge status: Home / Self Care | Payer: Medicare Other | Source: Ambulatory Visit | Attending: Family Medicine | Admitting: Family Medicine

## 2012-04-04 DIAGNOSIS — M792 Neuralgia and neuritis, unspecified: Secondary | ICD-10-CM

## 2012-04-05 ENCOUNTER — Other Ambulatory Visit: Payer: Self-pay | Admitting: Family Medicine

## 2012-04-05 DIAGNOSIS — M792 Neuralgia and neuritis, unspecified: Secondary | ICD-10-CM

## 2012-04-08 ENCOUNTER — Ambulatory Visit (INDEPENDENT_AMBULATORY_CARE_PROVIDER_SITE_OTHER): Payer: Medicare Other | Admitting: Physician Assistant

## 2012-04-08 DIAGNOSIS — F200 Paranoid schizophrenia: Secondary | ICD-10-CM

## 2012-04-08 MED ORDER — BENZTROPINE MESYLATE 1 MG PO TABS: ORAL_TABLET | ORAL | Status: DC

## 2012-04-08 NOTE — Progress Notes (Signed)
   [facility] Follow-up Outpatient Visit  [name] Mar 15, 1956  Date: 04/08/2012   Subjective: [name] presents today to followup on his treatment for schizophrenia. He reports that his sleep has improved, but he continues to experience a weekly episode of sleeplessness for 24 hours. He feels that his medications are working well. He has decreased his Haldol dose to one half of what is prescribed, and he states that he is more "fluid" in his thought and physical motion. He continues to have a side effect of a cough or throat clearing type tic, as well as some restlessness. He states that he is cooking more meals at home, which helps him financially as well as give him more time in his day. He does describe some minor auditory hallucinations, but he denies that they have any derogatory or command nature. He denies that he has any paranoia. He denies any suicidal or homicidal ideation.  There were no vitals filed for this visit.  Mental Status Examination  Appearance: Well groomed and neatly dressed Alert: Yes Attention: good  Cooperative: Yes Eye Contact: Good Speech: Clear and coherent Psychomotor Activity: Normal Memory/Concentration: Intact Oriented: person, place, time/date and situation Mood: Euthymic Affect: Flat Thought Processes and Associations: Logical Fund of Knowledge: Good Thought Content: Auditory hallucinations Insight: Good Judgement: Good  Diagnosis: Schizophrenia, paranoid type  Treatment Plan: We will continue his Remeron 15 mg at bedtime, Thorazine 150 mg twice daily, and Haldol 5 mg at bedtime, but he is allowed to take half dose of Haldol as he is. We'll increase his Cogentin to 2 mg each morning and 1 mg at bedtime. He will return for followup in 6 weeks.  Angy Swearengin, PA-C

## 2012-04-13 ENCOUNTER — Telehealth: Payer: Self-pay

## 2012-04-13 MED ORDER — TRAMADOL HCL 50 MG PO TABS: 50.0000 mg | ORAL_TABLET | Freq: Two times a day (BID) | ORAL | Status: DC | PRN

## 2012-04-13 NOTE — Telephone Encounter (Signed)
If still taking steroids, shouldn't be on other anti inflammatories. Recommend stop aleve, may try tramadol for pain - see if he's tried this in past.  Sent in.  To pcp as fyi

## 2012-04-13 NOTE — Telephone Encounter (Signed)
Noted, agreed

## 2012-04-13 NOTE — Telephone Encounter (Signed)
Triage Record Num: [id number] Operator: Judeen Hammans Patient Name: [name] Call Date & Time: 04/13/2012 12:38:07AM Patient Phone: [phone] PCP: Patient Gender: Male PCP Fax : Patient DOB: 1956/06/02 Practice Name: [facility] Reason for Call: Caller: Loic/Patient; PCP: Crawford Givens Clelia Croft); CB#: [phone]; Call regarding right shoulder pain that radiates down right arm to the point of nausea; Patient states he had an MRI done last week and was diagnosed with a bulging disc. Pt has taken aspirin to treat pain with no success. States he has tingling and numbness in that arm as well that has been an ongoing issue. Rates pain at an 8/10 on pain scale. All emergent symptoms ruled out per Shoulder Non-Injury Guideline except for "Severe pain with movement that limits normal activities." Offered to schedule an appointment for later today 8/5, however, patient states he is seeking to get a call back from Dr. Para March who is familiar with the situation. Explained that Dr. Para March is not currently on call - that if he would like to speak with him he needs to call the office when open. Patient states he will take some Aleve and call the office; is not interested in an appointment. Protocol(s) Used: Shoulder Non-Injury Recommended Outcome per Protocol: See Provider within 24 hours Reason for Outcome: Severe pain with movement that limits normal

## 2012-04-13 NOTE — Telephone Encounter (Signed)
Can we call to see how he's doing?   I see Dr. D referred to spine - when is this appt set up?

## 2012-04-13 NOTE — Telephone Encounter (Signed)
Patient sees NSG on 04/16/12. He is having to take 880 mg of aleve to sleep at night and just wanted someone to know. I advised if there was anything different that you suggested until his appt, I would give him a call back. I did advise to try ice or heat to see if they helped with the pain also.

## 2012-04-14 NOTE — Telephone Encounter (Signed)
Message left advising patient. Instructed to call back and let me know that he received my message. Will await return call.

## 2012-04-16 ENCOUNTER — Encounter (HOSPITAL_COMMUNITY): Payer: Self-pay | Admitting: Pharmacy Technician

## 2012-04-16 ENCOUNTER — Other Ambulatory Visit: Payer: Self-pay | Admitting: Neurosurgery

## 2012-04-18 ENCOUNTER — Other Ambulatory Visit: Payer: Self-pay | Admitting: Family Medicine

## 2012-04-18 NOTE — Telephone Encounter (Signed)
Routed to PCP 

## 2012-04-19 NOTE — Telephone Encounter (Signed)
Clarify this with patient.  It didn't help prev, according to the last OV with me.  If he wants it sent in, then proceed with BID dosing, #30, no rf.  Thanks.

## 2012-04-20 ENCOUNTER — Telehealth: Payer: Self-pay

## 2012-04-20 ENCOUNTER — Other Ambulatory Visit (HOSPITAL_COMMUNITY): Payer: Self-pay | Admitting: Physician Assistant

## 2012-04-20 MED ORDER — TRAMADOL HCL 50 MG PO TABS: 100.0000 mg | ORAL_TABLET | Freq: Three times a day (TID) | ORAL | Status: DC | PRN

## 2012-04-20 NOTE — Telephone Encounter (Signed)
Okay to take 2 at a time.  rx sent.

## 2012-04-20 NOTE — Telephone Encounter (Signed)
Sent, see phone note.

## 2012-04-20 NOTE — Telephone Encounter (Signed)
Received a form from pharmacy stating that patient states that he is taking 2 tablets two times a day and needs new rx. Please advise.

## 2012-04-20 NOTE — Telephone Encounter (Signed)
Patient advised.

## 2012-04-20 NOTE — Telephone Encounter (Signed)
Sent!

## 2012-04-20 NOTE — Telephone Encounter (Signed)
Triage Record Num: [id number] Operator: Bennie Hind Patient Name: [name] Call Date & Time: 04/18/2012 3:25:17PM Patient Phone: [phone] PCP: Patient Gender: Male PCP Fax : Patient DOB: 1956-05-26 Practice Name: [facility] Reason for Call: Caller: Cassandra/Patient; PCP: Crawford Givens Clelia Croft); CB#: [phone]; Call regarding Medication Issue; Medication(s): Tramadol; 04-18-12 He states ran out of his Tramadol this morning and would like a refill. I looked in epic it looks like it was ordered in November 2012. He wants it for chronic arm pain which is flaring up. declines triage Advised we could not refill Advised urgent care if pain severe and not controlled with over the counter meds. Protocol(s) Used: Office Note Recommended Outcome per Protocol: Information Noted and Sent to Office Reason for Outcome: Caller information to office Care Advice: ~

## 2012-04-20 NOTE — Telephone Encounter (Signed)
I phoned the patient.  He says it does help calm him down some although it doesn't totally take care of the pain.  He would indeed like the refill.  Please advise.

## 2012-04-20 NOTE — Telephone Encounter (Signed)
See note about calling this in.  Thanks.

## 2012-04-23 ENCOUNTER — Encounter (HOSPITAL_COMMUNITY)
Admission: RE | Admit: 2012-04-23 | Discharge: 2012-04-23 | Disposition: A | Discharge status: Home / Self Care | Payer: Medicare Other | Source: Ambulatory Visit | Attending: Neurosurgery | Admitting: Neurosurgery

## 2012-04-23 ENCOUNTER — Encounter (HOSPITAL_COMMUNITY): Payer: Self-pay

## 2012-04-23 LAB — COMPREHENSIVE METABOLIC PANEL
BUN: 8 mg/dL (ref 6–23)
CO2: 31 mEq/L (ref 19–32)
Calcium: 9.4 mg/dL (ref 8.4–10.5)
Creatinine, Ser: 0.6 mg/dL (ref 0.50–1.35)
GFR calc Af Amer: >90 mL/min (ref >90)
GFR calc non Af Amer: >90 mL/min (ref >90)
Glucose, Bld: 92 mg/dL (ref 70–99)
Total Protein: 7.4 g/dL (ref 6.0–8.3)

## 2012-04-23 LAB — CBC WITH DIFFERENTIAL/PLATELET
Basophils Absolute: 0 10*3/uL (ref 0.0–0.1)
Basophils Relative: 1 % (ref 0–1)
Eosinophils Relative: 2 % (ref 0–5)
Lymphocytes Relative: 25 % (ref 12–46)
MCHC: 36.7 g/dL — ABNORMAL HIGH (ref 30.0–36.0)
Monocytes Absolute: 0.8 10*3/uL (ref 0.1–1.0)
Neutro Abs: 5.3 10*3/uL (ref 1.7–7.7)
Platelets: 173 10*3/uL (ref 150–400)
RDW: 12.3 % (ref 11.5–15.5)
WBC: 8.3 10*3/uL (ref 4.0–10.5)

## 2012-04-23 LAB — ABO/RH: ABO/RH(D): O POS

## 2012-04-23 LAB — TYPE AND SCREEN: ABO/RH(D): O POS

## 2012-04-23 NOTE — Pre-Procedure Instructions (Signed)
20 [name]  04/23/2012   Your procedure is scheduled on:  Monday April 27, 2012  Report to [name] at 5:30 AM.  Call this number if you have problems the morning of surgery: [phone]   Remember:   Do not eat food or drink:After Midnight.    Take these medicines the morning of surgery with A SIP OF WATER: cogentine, thorazine, tramadol   Do not wear jewelry, make-up or nail polish.  Do not wear lotions, powders, or perfumes. You may wear deodorant.  Do not shave 48 hours prior to surgery. Men may shave face and neck.  Do not bring valuables to the hospital.  Contacts, dentures or bridgework may not be worn into surgery.  Leave suitcase in the car. After surgery it may be brought to your room.  For patients admitted to the hospital, checkout time is 11:00 AM the day of discharge.   Patients discharged the day of surgery will not be allowed to drive home.  Name and phone number of your driver: family / friend  Special Instructions: CHG Shower Use Special Wash: 1/2 bottle night before surgery and 1/2 bottle morning of surgery.   Please read over the following fact sheets that you were given: Pain Booklet, Coughing and Deep Breathing, MRSA Information and Surgical Site Infection Prevention, Blood transfusion

## 2012-04-26 MED ORDER — CEFAZOLIN SODIUM-DEXTROSE 2-3 GM-% IV SOLR
2.0000 g | INTRAVENOUS | Status: AC
  Administered 2012-04-27: 2 g via INTRAVENOUS
  Filled 2012-04-26 (×2): qty 50

## 2012-04-27 ENCOUNTER — Encounter (HOSPITAL_COMMUNITY): Payer: Self-pay | Admitting: *Deleted

## 2012-04-27 ENCOUNTER — Encounter (HOSPITAL_COMMUNITY)
Admission: AD | Disposition: A | Discharge status: Home / Self Care | Payer: Self-pay | Source: Ambulatory Visit | Attending: Neurosurgery

## 2012-04-27 ENCOUNTER — Encounter (HOSPITAL_COMMUNITY): Payer: Self-pay | Admitting: Anesthesiology

## 2012-04-27 ENCOUNTER — Inpatient Hospital Stay (HOSPITAL_COMMUNITY): Payer: Medicare Other

## 2012-04-27 ENCOUNTER — Inpatient Hospital Stay (HOSPITAL_COMMUNITY)
Admission: AD | Admit: 2012-04-27 | Discharge: 2012-04-28 | DRG: 473 | Disposition: A | Discharge status: Home / Self Care | Payer: Medicare Other | Source: Ambulatory Visit | Attending: Neurosurgery | Admitting: Neurosurgery

## 2012-04-27 ENCOUNTER — Inpatient Hospital Stay (HOSPITAL_COMMUNITY): Payer: Medicare Other | Admitting: Anesthesiology

## 2012-04-27 DIAGNOSIS — F17200 Nicotine dependence, unspecified, uncomplicated: Secondary | ICD-10-CM | POA: Diagnosis present

## 2012-04-27 DIAGNOSIS — M5020 Other cervical disc displacement, unspecified cervical region: Principal | ICD-10-CM | POA: Diagnosis present

## 2012-04-27 DIAGNOSIS — Z01812 Encounter for preprocedural laboratory examination: Secondary | ICD-10-CM

## 2012-04-27 DIAGNOSIS — M5010 Cervical disc disorder with radiculopathy, unspecified cervical region: Secondary | ICD-10-CM | POA: Diagnosis present

## 2012-04-27 HISTORY — PX: ANTERIOR CERVICAL DECOMP/DISCECTOMY FUSION: SHX1161

## 2012-04-27 SURGERY — ANTERIOR CERVICAL DECOMPRESSION/DISCECTOMY FUSION 1 LEVEL
Anesthesia: General | Site: Spine Cervical | Wound class: Clean

## 2012-04-27 MED ORDER — SODIUM CHLORIDE 0.9 % IR SOLN
Status: DC | PRN
  Administered 2012-04-27: 09:00:00

## 2012-04-27 MED ORDER — ARTIFICIAL TEARS OP OINT
TOPICAL_OINTMENT | OPHTHALMIC | Status: DC | PRN
  Administered 2012-04-27: 1 via OPHTHALMIC

## 2012-04-27 MED ORDER — ROCURONIUM BROMIDE 100 MG/10ML IV SOLN
INTRAVENOUS | Status: DC | PRN
  Administered 2012-04-27: 50 mg via INTRAVENOUS

## 2012-04-27 MED ORDER — BENZTROPINE MESYLATE 2 MG PO TABS
2.0000 mg | ORAL_TABLET | Freq: Two times a day (BID) | ORAL | Status: DC
  Administered 2012-04-27 – 2012-04-28 (×3): 2 mg via ORAL
  Filled 2012-04-27 (×5): qty 1

## 2012-04-27 MED ORDER — ACETAMINOPHEN 650 MG RE SUPP: 650.0000 mg | RECTAL | Status: DC | PRN

## 2012-04-27 MED ORDER — MUPIROCIN 2 % EX OINT
TOPICAL_OINTMENT | CUTANEOUS | Status: AC
  Administered 2012-04-27: 1 via NASAL
  Filled 2012-04-27: qty 22

## 2012-04-27 MED ORDER — TRAMADOL HCL 50 MG PO TABS
100.0000 mg | ORAL_TABLET | Freq: Three times a day (TID) | ORAL | Status: DC | PRN
  Administered 2012-04-27 – 2012-04-28 (×3): 100 mg via ORAL
  Filled 2012-04-27 (×4): qty 2

## 2012-04-27 MED ORDER — LIDOCAINE HCL 1 % IJ SOLN
INTRAMUSCULAR | Status: DC | PRN
  Administered 2012-04-27: 40 mg via INTRADERMAL

## 2012-04-27 MED ORDER — CYCLOBENZAPRINE HCL 10 MG PO TABS
10.0000 mg | ORAL_TABLET | Freq: Three times a day (TID) | ORAL | Status: DC | PRN
  Administered 2012-04-27: 10 mg via ORAL
  Filled 2012-04-27: qty 1

## 2012-04-27 MED ORDER — DEXAMETHASONE SODIUM PHOSPHATE 10 MG/ML IJ SOLN
10.0000 mg | INTRAMUSCULAR | Status: DC
  Filled 2012-04-27: qty 1

## 2012-04-27 MED ORDER — HYDROMORPHONE HCL PF 1 MG/ML IJ SOLN
0.5000 mg | INTRAMUSCULAR | Status: DC | PRN
  Administered 2012-04-27: 1 mg via INTRAVENOUS
  Filled 2012-04-27: qty 1

## 2012-04-27 MED ORDER — LIDOCAINE HCL 4 % MT SOLN
OROMUCOSAL | Status: DC | PRN
  Administered 2012-04-27: 4 mL via TOPICAL

## 2012-04-27 MED ORDER — ONDANSETRON HCL 4 MG/2ML IJ SOLN
INTRAMUSCULAR | Status: DC | PRN
  Administered 2012-04-27: 4 mg via INTRAVENOUS

## 2012-04-27 MED ORDER — NEOSTIGMINE METHYLSULFATE 1 MG/ML IJ SOLN
INTRAMUSCULAR | Status: DC | PRN
  Administered 2012-04-27: 5 mg via INTRAVENOUS

## 2012-04-27 MED ORDER — ZOLPIDEM TARTRATE 5 MG PO TABS: 5.0000 mg | ORAL_TABLET | Freq: Every evening | ORAL | Status: DC | PRN

## 2012-04-27 MED ORDER — SODIUM CHLORIDE 0.9 % IJ SOLN: 3.0000 mL | INTRAMUSCULAR | Status: DC | PRN

## 2012-04-27 MED ORDER — FLEET ENEMA 7-19 GM/118ML RE ENEM
1.0000 | ENEMA | Freq: Once | RECTAL | Status: AC | PRN
  Filled 2012-04-27: qty 1

## 2012-04-27 MED ORDER — ACETAMINOPHEN 10 MG/ML IV SOLN
INTRAVENOUS | Status: AC
  Administered 2012-04-27: 1000 mg via INTRAVENOUS
  Filled 2012-04-27: qty 100

## 2012-04-27 MED ORDER — ALUM & MAG HYDROXIDE-SIMETH 200-200-20 MG/5ML PO SUSP: 30.0000 mL | Freq: Four times a day (QID) | ORAL | Status: DC | PRN

## 2012-04-27 MED ORDER — PHENOL 1.4 % MT LIQD: 1.0000 | OROMUCOSAL | Status: DC | PRN

## 2012-04-27 MED ORDER — MIRTAZAPINE 15 MG PO TABS
15.0000 mg | ORAL_TABLET | Freq: Every day | ORAL | Status: DC
  Administered 2012-04-27: 15 mg via ORAL
  Filled 2012-04-27 (×2): qty 1

## 2012-04-27 MED ORDER — MEPERIDINE HCL 25 MG/ML IJ SOLN: 6.2500 mg | INTRAMUSCULAR | Status: DC | PRN

## 2012-04-27 MED ORDER — FENTANYL CITRATE 0.05 MG/ML IJ SOLN
INTRAMUSCULAR | Status: DC | PRN
  Administered 2012-04-27 (×2): 50 ug via INTRAVENOUS
  Administered 2012-04-27: 150 ug via INTRAVENOUS

## 2012-04-27 MED ORDER — POLYETHYLENE GLYCOL 3350 17 G PO PACK
17.0000 g | PACK | Freq: Every day | ORAL | Status: DC | PRN
  Filled 2012-04-27: qty 1

## 2012-04-27 MED ORDER — PROMETHAZINE HCL 25 MG/ML IJ SOLN: 6.2500 mg | INTRAMUSCULAR | Status: DC | PRN

## 2012-04-27 MED ORDER — CHLORPROMAZINE HCL 50 MG PO TABS: 150.0000 mg | ORAL_TABLET | Freq: Two times a day (BID) | ORAL | Status: DC

## 2012-04-27 MED ORDER — GLYCOPYRROLATE 0.2 MG/ML IJ SOLN
INTRAMUSCULAR | Status: DC | PRN
  Administered 2012-04-27: 0.6 mg via INTRAVENOUS

## 2012-04-27 MED ORDER — SODIUM CHLORIDE 0.9 % IV SOLN
INTRAVENOUS | Status: AC
  Filled 2012-04-27: qty 500

## 2012-04-27 MED ORDER — LACTATED RINGERS IV SOLN
INTRAVENOUS | Status: DC | PRN
  Administered 2012-04-27 (×2): via INTRAVENOUS

## 2012-04-27 MED ORDER — CHLORPROMAZINE HCL 50 MG PO TABS
150.0000 mg | ORAL_TABLET | Freq: Two times a day (BID) | ORAL | Status: DC
  Administered 2012-04-27 – 2012-04-28 (×3): 150 mg via ORAL
  Filled 2012-04-27 (×4): qty 1

## 2012-04-27 MED ORDER — PHENYLEPHRINE HCL 10 MG/ML IJ SOLN
INTRAMUSCULAR | Status: DC | PRN
  Administered 2012-04-27: 80 ug via INTRAVENOUS
  Administered 2012-04-27 (×2): 40 ug via INTRAVENOUS
  Administered 2012-04-27 (×2): 80 ug via INTRAVENOUS
  Administered 2012-04-27 (×2): 40 ug via INTRAVENOUS

## 2012-04-27 MED ORDER — HYDROCODONE-ACETAMINOPHEN 5-325 MG PO TABS
1.0000 | ORAL_TABLET | ORAL | Status: DC | PRN
  Filled 2012-04-27: qty 2

## 2012-04-27 MED ORDER — SODIUM CHLORIDE 0.9 % IJ SOLN
3.0000 mL | Freq: Two times a day (BID) | INTRAMUSCULAR | Status: DC
  Administered 2012-04-27 (×2): 3 mL via INTRAVENOUS

## 2012-04-27 MED ORDER — HYDROMORPHONE HCL PF 1 MG/ML IJ SOLN
INTRAMUSCULAR | Status: AC
Start: 2012-04-27 — End: 2012-04-27
  Filled 2012-04-27: qty 1

## 2012-04-27 MED ORDER — PROPOFOL 10 MG/ML IV EMUL
INTRAVENOUS | Status: DC | PRN
  Administered 2012-04-27: 200 mg via INTRAVENOUS

## 2012-04-27 MED ORDER — SODIUM CHLORIDE 0.9 % IV SOLN: 250.0000 mL | INTRAVENOUS | Status: DC

## 2012-04-27 MED ORDER — HYDROMORPHONE HCL PF 1 MG/ML IJ SOLN
0.2500 mg | INTRAMUSCULAR | Status: DC | PRN
  Administered 2012-04-27 (×3): 0.5 mg via INTRAVENOUS

## 2012-04-27 MED ORDER — BISACODYL 10 MG RE SUPP: 10.0000 mg | Freq: Every day | RECTAL | Status: DC | PRN

## 2012-04-27 MED ORDER — SENNA 8.6 MG PO TABS
1.0000 | ORAL_TABLET | Freq: Two times a day (BID) | ORAL | Status: DC
  Administered 2012-04-27 – 2012-04-28 (×3): 8.6 mg via ORAL
  Filled 2012-04-27 (×4): qty 1

## 2012-04-27 MED ORDER — HEMOSTATIC AGENTS (NO CHARGE) OPTIME
TOPICAL | Status: DC | PRN
  Administered 2012-04-27: 1 via TOPICAL

## 2012-04-27 MED ORDER — MENTHOL 3 MG MT LOZG
1.0000 | LOZENGE | OROMUCOSAL | Status: DC | PRN
  Administered 2012-04-27: 3 mg via ORAL
  Filled 2012-04-27: qty 9

## 2012-04-27 MED ORDER — ONDANSETRON HCL 4 MG/2ML IJ SOLN: 4.0000 mg | INTRAMUSCULAR | Status: DC | PRN

## 2012-04-27 MED ORDER — THROMBIN 5000 UNITS EX KIT
PACK | CUTANEOUS | Status: DC | PRN
  Administered 2012-04-27 (×2): 5000 [IU] via TOPICAL

## 2012-04-27 MED ORDER — CEFAZOLIN SODIUM 1-5 GM-% IV SOLN
1.0000 g | Freq: Three times a day (TID) | INTRAVENOUS | Status: AC
  Administered 2012-04-27 (×2): 1 g via INTRAVENOUS
  Filled 2012-04-27 (×2): qty 50

## 2012-04-27 MED ORDER — ACETAMINOPHEN 325 MG PO TABS: 650.0000 mg | ORAL_TABLET | ORAL | Status: DC | PRN

## 2012-04-27 MED ORDER — DEXAMETHASONE SODIUM PHOSPHATE 10 MG/ML IJ SOLN
INTRAMUSCULAR | Status: DC | PRN
  Administered 2012-04-27: 10 mg via INTRAVENOUS

## 2012-04-27 MED ORDER — BACITRACIN 50000 UNITS IM SOLR
INTRAMUSCULAR | Status: AC
  Filled 2012-04-27: qty 1

## 2012-04-27 MED ORDER — HALOPERIDOL 5 MG PO TABS
5.0000 mg | ORAL_TABLET | Freq: Every day | ORAL | Status: DC
  Administered 2012-04-27: 5 mg via ORAL
  Filled 2012-04-27 (×2): qty 1

## 2012-04-27 MED ORDER — MIDAZOLAM HCL 2 MG/2ML IJ SOLN: 0.5000 mg | Freq: Once | INTRAMUSCULAR | Status: DC | PRN

## 2012-04-27 MED ORDER — HYDROMORPHONE HCL PF 1 MG/ML IJ SOLN
INTRAMUSCULAR | Status: AC
  Filled 2012-04-27: qty 1

## 2012-04-27 MED ORDER — 0.9 % SODIUM CHLORIDE (POUR BTL) OPTIME
TOPICAL | Status: DC | PRN
  Administered 2012-04-27: 1000 mL

## 2012-04-27 MED ORDER — OXYCODONE-ACETAMINOPHEN 5-325 MG PO TABS
1.0000 | ORAL_TABLET | ORAL | Status: DC | PRN
  Administered 2012-04-27: 2 via ORAL
  Filled 2012-04-27: qty 2

## 2012-04-27 MED ORDER — MUPIROCIN 2 % EX OINT
TOPICAL_OINTMENT | Freq: Two times a day (BID) | CUTANEOUS | Status: DC
  Administered 2012-04-27 – 2012-04-28 (×2): via NASAL

## 2012-04-27 MED ORDER — MIDAZOLAM HCL 5 MG/5ML IJ SOLN
INTRAMUSCULAR | Status: DC | PRN
  Administered 2012-04-27: 2 mg via INTRAVENOUS

## 2012-04-27 SURGICAL SUPPLY — 56 items
APL SKNCLS STERI-STRIP NONHPOA (GAUZE/BANDAGES/DRESSINGS) ×1
BAG DECANTER FOR FLEXI CONT (MISCELLANEOUS) ×2 IMPLANT
BENZOIN TINCTURE PRP APPL 2/3 (GAUZE/BANDAGES/DRESSINGS) ×2 IMPLANT
BRUSH SCRUB EZ PLAIN DRY (MISCELLANEOUS) ×2 IMPLANT
BUR MATCHSTICK NEURO 3.0 LAGG (BURR) ×2 IMPLANT
CANISTER SUCTION 2500CC (MISCELLANEOUS) ×2 IMPLANT
CLOTH BEACON ORANGE TIMEOUT ST (SAFETY) ×2 IMPLANT
CONT SPEC 4OZ CLIKSEAL STRL BL (MISCELLANEOUS) ×2 IMPLANT
DRAPE C-ARM 42X72 X-RAY (DRAPES) ×4 IMPLANT
DRAPE LAPAROTOMY 100X72 PEDS (DRAPES) ×2 IMPLANT
DRAPE MICROSCOPE LEICA (MISCELLANEOUS) ×1 IMPLANT
DRAPE MICROSCOPE ZEISS OPMI (DRAPES) ×1 IMPLANT
DRAPE POUCH INSTRU U-SHP 10X18 (DRAPES) ×2 IMPLANT
DRILL BIT (BIT) ×1 IMPLANT
ELECT COATED BLADE 2.86 ST (ELECTRODE) ×2 IMPLANT
ELECT REM PT RETURN 9FT ADLT (ELECTROSURGICAL) ×2
ELECTRODE REM PT RTRN 9FT ADLT (ELECTROSURGICAL) ×1 IMPLANT
GAUZE SPONGE 4X4 16PLY XRAY LF (GAUZE/BANDAGES/DRESSINGS) IMPLANT
GLOVE BIO SURGEON STRL SZ8.5 (GLOVE) ×1 IMPLANT
GLOVE BIOGEL PI IND STRL 7.0 (GLOVE) IMPLANT
GLOVE BIOGEL PI INDICATOR 7.0 (GLOVE) ×1
GLOVE ECLIPSE 8.5 STRL (GLOVE) ×2 IMPLANT
GLOVE EXAM NITRILE LRG STRL (GLOVE) IMPLANT
GLOVE EXAM NITRILE MD LF STRL (GLOVE) ×1 IMPLANT
GLOVE EXAM NITRILE XL STR (GLOVE) IMPLANT
GLOVE EXAM NITRILE XS STR PU (GLOVE) IMPLANT
GLOVE SS BIOGEL STRL SZ 8 (GLOVE) IMPLANT
GLOVE SUPERSENSE BIOGEL SZ 8 (GLOVE) ×1
GLOVE SURG SS PI 6.5 STRL IVOR (GLOVE) ×2 IMPLANT
GOWN BRE IMP SLV AUR LG STRL (GOWN DISPOSABLE) ×1 IMPLANT
GOWN BRE IMP SLV AUR XL STRL (GOWN DISPOSABLE) ×2 IMPLANT
GOWN STRL REIN 2XL LVL4 (GOWN DISPOSABLE) IMPLANT
HEAD HALTER (SOFTGOODS) ×2 IMPLANT
KIT BASIN OR (CUSTOM PROCEDURE TRAY) ×2 IMPLANT
KIT ROOM TURNOVER OR (KITS) ×2 IMPLANT
NDL SPNL 20GX3.5 QUINCKE YW (NEEDLE) ×1 IMPLANT
NEEDLE SPNL 20GX3.5 QUINCKE YW (NEEDLE) ×2 IMPLANT
NS IRRIG 1000ML POUR BTL (IV SOLUTION) ×2 IMPLANT
PACK LAMINECTOMY NEURO (CUSTOM PROCEDURE TRAY) ×2 IMPLANT
PAD ARMBOARD 7.5X6 YLW CONV (MISCELLANEOUS) ×6 IMPLANT
PLATE ELITE VISION 25MM (Plate) ×1 IMPLANT
RUBBERBAND STERILE (MISCELLANEOUS) ×4 IMPLANT
SCREW ST FIX 4 ATL 3120213 (Screw) ×4 IMPLANT
SPACER BONE CORNERSTONE 7X14 (Orthopedic Implant) ×1 IMPLANT
SPONGE GAUZE 4X4 12PLY (GAUZE/BANDAGES/DRESSINGS) ×2 IMPLANT
SPONGE INTESTINAL PEANUT (DISPOSABLE) ×2 IMPLANT
SPONGE SURGIFOAM ABS GEL SZ50 (HEMOSTASIS) ×2 IMPLANT
STRIP CLOSURE SKIN 1/2X4 (GAUZE/BANDAGES/DRESSINGS) ×2 IMPLANT
SUT PDS AB 5-0 P3 18 (SUTURE) ×2 IMPLANT
SUT VIC AB 3-0 SH 8-18 (SUTURE) ×2 IMPLANT
SYR 20ML ECCENTRIC (SYRINGE) ×2 IMPLANT
TAPE CLOTH 4X10 WHT NS (GAUZE/BANDAGES/DRESSINGS) ×1 IMPLANT
TAPE CLOTH SURG 4X10 WHT LF (GAUZE/BANDAGES/DRESSINGS) ×1 IMPLANT
TOWEL OR 17X24 6PK STRL BLUE (TOWEL DISPOSABLE) ×2 IMPLANT
TOWEL OR 17X26 10 PK STRL BLUE (TOWEL DISPOSABLE) ×2 IMPLANT
WATER STERILE IRR 1000ML POUR (IV SOLUTION) ×2 IMPLANT

## 2012-04-27 NOTE — H&P (Signed)
[name] is an 56 y.o. male.   Chief Complaint: Right arm pain HPI: 56 year old male with history of schizophrenia presents with neck and right upper extremity pain consistent with cervical ray copy workup demonstrates evidence of a large right-sided C5-6 disc herniation with compression of the spinal cord and right-sided C6 nerve root. Patient presents now for anterior cervical discectomy and fusion for hopeful improvement symptoms.  Past Medical History  Diagnosis Date  . Substance abuse in remission   . Schizophrenia   . 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 Lumbar disc surgery     Family History  Problem Relation Age of Onset  . Bipolar disorder Brother   . Drug abuse Brother   . Drug abuse Brother   . Bipolar disorder Brother    Social History:  reports that he has been smoking Cigarettes.  He has a 24 pack-year smoking history. He has quit using smokeless tobacco. He reports that he drinks alcohol. He reports that he does not use illicit drugs.  Allergies: No Known Allergies  Medications Prior to Admission  Medication Sig Dispense Refill  . benztropine (COGENTIN) 1 MG tablet Take 2 mg by mouth 2 (two) times daily.      . chlorproMAZINE (THORAZINE) 50 MG tablet Take 150 mg by mouth 2 (two) times daily.      . chlorproMAZINE (THORAZINE) 50 MG tablet TAKE 3 TABLETS (150 MG TOTAL) BY MOUTH 2 (TWO) TIMES DAILY.  180 tablet  1  . haloperidol (HALDOL) 5 MG tablet Take 5 mg by mouth at bedtime.      . mirtazapine (REMERON) 15 MG tablet Take 15 mg by mouth at bedtime.      . traMADol (ULTRAM) 50 MG tablet Take 2 tablets (100 mg total) by mouth 3 (three) times daily as needed. For pain  60 tablet  2    No results found for this or any previous visit (from the past 48 hour(s)). No results found.  Review of Systems  Constitutional: Negative.   HENT: Negative.   Eyes: Negative.   Respiratory: Negative.     Cardiovascular: Negative.   Gastrointestinal: Negative.   Genitourinary: Negative.   Musculoskeletal: Negative.   Skin: Negative.   Neurological: Negative.   Endo/Heme/Allergies: Negative.   Psychiatric/Behavioral: Positive for hallucinations. The patient is nervous/anxious.     Blood pressure 153/89, pulse 89, temperature 98.5 F (36.9 C), temperature source Oral, resp. rate 18, SpO2 98.00%. Physical Exam  Constitutional: He is oriented to person, place, and time. He appears well-developed and well-nourished.  HENT:  Head: Normocephalic and atraumatic.  Right Ear: External ear normal.  Left Ear: External ear normal.  Nose: Nose normal.  Mouth/Throat: Oropharynx is clear and moist.  Eyes: Conjunctivae and EOM are normal. Pupils are equal, round, and reactive to light. Right eye exhibits no discharge. Left eye exhibits no discharge. No scleral icterus.  Neck: Normal range of motion. Neck supple. No JVD present. No tracheal deviation present. No thyromegaly present.  Cardiovascular: Normal rate, regular rhythm, normal heart sounds and intact distal pulses.  Exam reveals no gallop and no friction rub.   No murmur heard. Respiratory: Effort normal and breath sounds normal. No stridor. No respiratory distress. He has no wheezes. He has no rales. He exhibits no tenderness.  GI: Soft. Bowel sounds are normal. He exhibits no distension. There is no tenderness.  Musculoskeletal: Normal range of  motion. He exhibits no edema and no tenderness.  Lymphadenopathy:    He has no cervical adenopathy.  Neurological: He is alert and oriented to person, place, and time. He has normal reflexes. No cranial nerve deficit. Coordination normal.  Skin: Skin is warm and dry. No rash noted. No erythema.  Psychiatric: He has a normal mood and affect. His behavior is normal. Judgment and thought content normal.     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 Plan C5-6 anterior cervical  discectomy and fusion with allograft and anterior plating. Risks and benefits been explained. Patient wishes to proceed.  Karder Goodin A 04/27/2012, 7:47 AM

## 2012-04-27 NOTE — Progress Notes (Signed)
UR COMPLETED  

## 2012-04-27 NOTE — Anesthesia Preprocedure Evaluation (Addendum)
Anesthesia Evaluation  Patient identified by MRN, date of birth, ID band Patient awake    Reviewed: Allergy & Precautions, H&P , NPO status , Patient's Chart, lab work & pertinent test results  History of Anesthesia Complications Negative for: history of anesthetic complications  Airway Mallampati: II TM Distance: >3 FB Neck ROM: Full    Dental  (+) Edentulous Upper and Edentulous Lower   Pulmonary COPD (by CXR-stable)Current Smoker,  breath sounds clear to auscultation  Pulmonary exam normal       Cardiovascular negative cardio ROS  Rhythm:Regular Rate:Normal     Neuro/Psych PSYCHIATRIC DISORDERS (paranoid schizophrenia-compliant with meds) Anxiety Schizophrenia  Neuromuscular disease (RUE radiculopathy )    GI/Hepatic negative GI ROS, Neg liver ROS, H/o substance abuse   Endo/Other  negative endocrine ROS  Renal/GU negative Renal ROS  negative genitourinary   Musculoskeletal negative musculoskeletal ROS (+)   Abdominal   Peds  Hematology negative hematology ROS (+)   Anesthesia Other Findings   Reproductive/Obstetrics negative OB ROS                         Anesthesia Physical Anesthesia Plan  ASA: II  Anesthesia Plan: General   Post-op Pain Management:    Induction: Intravenous  Airway Management Planned: Oral ETT  Additional Equipment:   Intra-op Plan:   Post-operative Plan: Extubation in OR  Informed Consent: I have reviewed the patients History and Physical, chart, labs and discussed the procedure including the risks, benefits and alternatives for the proposed anesthesia with the patient or authorized representative who has indicated his/her understanding and acceptance.   Dental advisory given  Plan Discussed with: CRNA and Surgeon  Anesthesia Plan Comments: (Plan routine monitors, GETA)        Anesthesia Quick Evaluation

## 2012-04-27 NOTE — Preoperative (Signed)
Beta Blockers   Reason not to administer Beta Blockers:Not Applicable 

## 2012-04-27 NOTE — Op Note (Signed)
Date of procedure: 04/27/2012  Date of dictation: Same  Service: Neurosurgery  Preoperative diagnosis: Right C5-6 herniated stenosis with radiculopathy  Postoperative diagnosis: Same  Procedure Name: C5-6 anterior cervical discectomy and fusion with allograft and anterior plating.  Surgeon:Doneshia Hill A.Cortasia Screws, M.D.  Asst. Surgeon: Tressie Stalker  Anesthesia: General  Indication: 56-year-old male with a right-sided C5-6 herniated pulposus with radiculopathy. Patient presents now for anterior cervical discectomy and fusion with allograft and replating for hopeful improvement of symptoms.  Operative note: After induction of anesthesia, patient positioned supine with neck slightly extended and held in place with halter traction. Anterior cervical region prepped and draped sterilely. Incision made overlying the C5-6 interspace. Dissection carried down to the platysma. Platysmas and divided vertically and dissection proceeded along the medial border of the sternocleidomastoid muscle and carotid sheath. Trachea and esophagus were mobilized retracted towards the left. Prevertebral fascia stripped off the anterior spinal column. Longus coli muscles elevated bilaterally using electrocautery. Deep self-retaining placed intraoperative fluoroscopy used C5-6 level confirmed. Disc space incised in a rectangular fashion. Y. disc space cleanly been achieved using pituitary rongeurs forward and backward on her own correct care spreaders high-speed drill predominance disc removed down to the posterior annulus very much in the field used at the remainder of the discectomy. Remaining aspects of the annulus and osteophytes removed using a high-speed drill down to the level of the posterior longitudinal inferior posterior arch was elevated in her second piece of fascia superior surgery underlying thecal sac was identified. Once the decompression and perform anything bodies of C5 and C6. He presented to the extraforaminal.  Wide anterior foraminotomies were then performed on course exiting C6 nerve roots bilaterally at almost the disc herniation of the right-sided C5-6 were completely resected. At this point a very thorough decompression had been achieved. There is no evidence of injury to the thecal sac and nerve roots. Wound was then irrigated with saline solution. Gelfoam. Hemostasis as needed. 7 mm cornerstone allograft wedges and packed in place for cefazolin military and the anterior cortical margin at C5 and C6. 25 mg centers around it was a pleasure to C5-C6 level. This fluoroscopic guidance using 13 mm fixed angle screws 2 each both levels were all 4 screws the final tightening done we solidly within bone. Locking screws both levels. On knees reveal good position bothers her probable neuroma spine. Once the area anesthesia Bier hemostasis was achieved about her nerve root was closed in layers with Vicryl suture. Steri-Strips triggers were applied. There no apparent palpation the patient did well and he returns to confirm facet.

## 2012-04-27 NOTE — Anesthesia Postprocedure Evaluation (Signed)
  Anesthesia Post-op Note  Patient: [name]  Procedure(s) Performed: Procedure(s) (LRB): ANTERIOR CERVICAL DECOMPRESSION/DISCECTOMY FUSION 1 LEVEL (N/A)  Patient Location: PACU  Anesthesia Type: General  Level of Consciousness: awake, alert  and oriented  Airway and Oxygen Therapy: Patient Spontanous Breathing and Patient connected to nasal cannula oxygen  Post-op Pain: mild  Post-op Assessment: Post-op Vital signs reviewed, Patient's Cardiovascular Status Stable, Respiratory Function Stable, Patent Airway, No signs of Nausea or vomiting and Pain level controlled  Post-op Vital Signs: Reviewed and stable  Complications: No apparent anesthesia complications

## 2012-04-27 NOTE — Plan of Care (Signed)
Problem: Consults Goal: Diagnosis - Spinal Surgery Outcome: Completed/Met Date Met:  04/27/12 Cervical Spine Fusion     

## 2012-04-27 NOTE — Brief Op Note (Signed)
04/27/2012  9:04 AM  PATIENT:  Marcina Millard  56 y.o. male  PRE-OPERATIVE DIAGNOSIS:  HNP  POST-OPERATIVE DIAGNOSIS:  Herniated Nucleous Pulposus  PROCEDURE:  Procedure(s) (LRB): ANTERIOR CERVICAL DECOMPRESSION/DISCECTOMY FUSION 1 LEVEL (N/A)  SURGEON:  Surgeon(s) and Role:    * Temple Pacini, MD - Primary  PHYSICIAN ASSISTANT:   ASSISTANTS: Tressie Stalker   ANESTHESIA:   general  EBL:  Total I/O In: 1000 [I.V.:1000] Out: 50 [Blood:50]  BLOOD ADMINISTERED:none  DRAINS: none   LOCAL MEDICATIONS USED:  NONE  SPECIMEN:  No Specimen  DISPOSITION OF SPECIMEN:  N/A  COUNTS:  YES  TOURNIQUET:  * No tourniquets in log *  DICTATION: .Dragon Dictation  PLAN OF CARE: Admit to inpatient   PATIENT DISPOSITION:  PACU - hemodynamically stable.   Delay start of Pharmacological VTE agent (>24hrs) due to surgical blood loss or risk of bleeding: yes

## 2012-04-27 NOTE — Transfer of Care (Signed)
Immediate Anesthesia Transfer of Care Note  Patient: [name]  Procedure(s) Performed: Procedure(s) (LRB): ANTERIOR CERVICAL DECOMPRESSION/DISCECTOMY FUSION 1 LEVEL (N/A)  Patient Location: PACU  Anesthesia Type: General  Level of Consciousness: awake, alert , oriented and patient cooperative  Airway & Oxygen Therapy: Patient Spontanous Breathing and Patient connected to nasal cannula oxygen  Post-op Assessment: Report given to PACU RN, Post -op Vital signs reviewed and stable and Patient moving all extremities X 4  Post vital signs: Reviewed and stable  Complications: No apparent anesthesia complications

## 2012-04-28 ENCOUNTER — Encounter (HOSPITAL_COMMUNITY): Payer: Self-pay | Admitting: Neurosurgery

## 2012-04-28 MED ORDER — HYDROCODONE-ACETAMINOPHEN 5-325 MG PO TABS: 1.0000 | ORAL_TABLET | ORAL | Status: AC | PRN

## 2012-04-28 MED ORDER — CYCLOBENZAPRINE HCL 10 MG PO TABS: 10.0000 mg | ORAL_TABLET | Freq: Three times a day (TID) | ORAL | Status: AC | PRN

## 2012-04-28 NOTE — Discharge Summary (Signed)
Physician Discharge Summary  Patient ID: [name] MRN: [id number] DOB/AGE: December 07, 1955 57 y.o.  Admit date: 04/27/2012 Discharge date: 04/28/2012  Admission Diagnoses:  Discharge Diagnoses:  Principal Problem:  *Herniation of cervical intervertebral disc with radiculopathy   Discharged Condition: good  Hospital Course: Patient in the hospital oriented when an uncomplicated anterior cervical discectomy and fusion without after plating. Postoperatively his pain is much improved. Neurologically is intact. Rate for discharge home.  Consults:   Significant Diagnostic Studies:   Treatments:   Discharge Exam: Blood pressure 168/96, pulse 78, temperature 98.8 F (37.1 C), temperature source Oral, resp. rate 18, SpO2 95.00%. Awake and alert oriented and appropriate. Cranial nerve function is intact. Motor and sensory function extremities is normal. Wound clean dry and intact chest and abdomen benign.  Disposition: 01-Home or Self Care   Medication List  As of 04/28/2012 11:08 AM   TAKE these medications         benztropine 1 MG tablet   Commonly known as: COGENTIN   Take 2 mg by mouth 2 (two) times daily.      chlorproMAZINE 50 MG tablet   Commonly known as: THORAZINE   Take 150 mg by mouth 2 (two) times daily.      cyclobenzaprine 10 MG tablet   Commonly known as: FLEXERIL   Take 1 tablet (10 mg total) by mouth 3 (three) times daily as needed for muscle spasms.      haloperidol 5 MG tablet   Commonly known as: HALDOL   Take 5 mg by mouth at bedtime.      HYDROcodone-acetaminophen 5-325 MG per tablet   Commonly known as: NORCO/VICODIN   Take 1-2 tablets by mouth every 4 (four) hours as needed.      mirtazapine 15 MG tablet   Commonly known as: REMERON   Take 15 mg by mouth at bedtime.      traMADol 50 MG tablet   Commonly known as: ULTRAM   Take 2 tablets (100 mg total) by mouth 3 (three) times daily as needed. For pain           Follow-up Information    Follow up with Jannae Fagerstrom A, MD. Call in 1 week.   Contact information:   [address], [address] [place] [address] [phone]          Signed: Temple Pacini 04/28/2012, 11:08 AM

## 2012-04-30 ENCOUNTER — Encounter (HOSPITAL_COMMUNITY): Payer: Self-pay

## 2012-06-03 ENCOUNTER — Ambulatory Visit (INDEPENDENT_AMBULATORY_CARE_PROVIDER_SITE_OTHER): Payer: Medicare Other | Admitting: Physician Assistant

## 2012-06-03 DIAGNOSIS — F200 Paranoid schizophrenia: Secondary | ICD-10-CM

## 2012-06-03 MED ORDER — MIRTAZAPINE 15 MG PO TABS: 15.0000 mg | ORAL_TABLET | Freq: Every day | ORAL | Status: DC

## 2012-06-03 NOTE — Progress Notes (Signed)
   [facility]al [facility] Follow-up Outpatient Visit  DAYMION NAZAIRE 1956/02/08  Date: 06/03/2012   Subjective: Cypress presents today to followup on his treatment for schizophrenia. He had cervical for cubital fusion surgery approximately 3 weeks ago, and reports that his recovery is going well. He was prescribed oxycodone and tramadol after surgery, and found that when it ran out he experienced cravings for cocaine. He succumbed to these cravings in relapse for one day, then realized where it would take him so he stopped. He reports that his mood has been stable. He denies any suicidal or homicidal ideation. He did experience some increase in paranoia with the cocaine. He reports that he has dreamlike visual hallucinations if he does not stay in motion, and stares for a period of time.  There were no vitals filed for this visit.  Mental Status Examination  Appearance: Well groomed and neatly dressed Alert: Yes Attention: good  Cooperative: Yes Eye Contact: Good Speech: Clear and coherent Psychomotor Activity: Normal Memory/Concentration: Intact Oriented: person, place, time/date and situation Mood: Euthymic Affect: Flat Thought Processes and Associations: Linear Fund of Knowledge: Good Thought Content: Visual hallucinations Insight: Good Judgement: Good  Diagnosis: Schizophrenia, paranoid type  Treatment Plan: We will continue his Thorazine 150 mg twice daily, Haldol 5 mg at bedtime, Remeron 15 mg at bedtime, and Cogentin 1 mg twice daily. He will return for followup in 2 months.  Desyre Calma, PA-C

## 2012-06-12 ENCOUNTER — Telehealth (HOSPITAL_COMMUNITY): Payer: Self-pay

## 2012-06-15 ENCOUNTER — Telehealth (HOSPITAL_COMMUNITY): Payer: Self-pay

## 2012-06-16 ENCOUNTER — Other Ambulatory Visit: Payer: Self-pay | Admitting: Family Medicine

## 2012-06-16 NOTE — Telephone Encounter (Signed)
Electronic refill request.  Please advise. 

## 2012-06-17 NOTE — Telephone Encounter (Signed)
Noted  

## 2012-06-17 NOTE — Telephone Encounter (Signed)
Spoke to patient and was advised that he saw Dr. Jordan Likes. Patient states that he is improving, but he was advised by Dr. Jordan Likes  that it could take a couple of months for the pain to go completely away. He thinks the pain is coming from the plate that was put in.

## 2012-06-17 NOTE — Telephone Encounter (Signed)
Sent.  Please get update on condition from patient.

## 2012-06-24 ENCOUNTER — Other Ambulatory Visit (HOSPITAL_COMMUNITY): Payer: Self-pay | Admitting: Physician Assistant

## 2012-07-06 ENCOUNTER — Other Ambulatory Visit (HOSPITAL_COMMUNITY): Payer: Self-pay | Admitting: Physician Assistant

## 2012-07-06 DIAGNOSIS — F200 Paranoid schizophrenia: Secondary | ICD-10-CM

## 2012-07-24 ENCOUNTER — Telehealth (HOSPITAL_COMMUNITY): Payer: Self-pay

## 2012-07-24 NOTE — Telephone Encounter (Signed)
07/24/12 PT CALLED COMPLAINTING ABOUT HIS BILL - INFORMED PT THAT ON HIS BILL THAT THERE IS A CONTACT # ON THE BILL - PT NEED TO CALL THAT NUMBER./SH

## 2012-07-28 ENCOUNTER — Ambulatory Visit (HOSPITAL_COMMUNITY): Payer: Medicare Other | Admitting: Physician Assistant

## 2012-07-28 ENCOUNTER — Other Ambulatory Visit (HOSPITAL_COMMUNITY): Payer: Self-pay | Admitting: Physician Assistant

## 2012-07-30 ENCOUNTER — Ambulatory Visit (HOSPITAL_COMMUNITY): Payer: Self-pay | Admitting: Physician Assistant

## 2012-08-18 ENCOUNTER — Other Ambulatory Visit (HOSPITAL_COMMUNITY): Payer: Self-pay | Admitting: Physician Assistant

## 2012-08-18 DIAGNOSIS — F200 Paranoid schizophrenia: Secondary | ICD-10-CM

## 2012-08-20 ENCOUNTER — Other Ambulatory Visit (HOSPITAL_COMMUNITY): Payer: Self-pay | Admitting: Physician Assistant

## 2012-09-23 ENCOUNTER — Other Ambulatory Visit: Payer: Self-pay | Admitting: Family Medicine

## 2012-09-23 DIAGNOSIS — Z13220 Encounter for screening for lipoid disorders: Secondary | ICD-10-CM

## 2012-09-23 DIAGNOSIS — Z125 Encounter for screening for malignant neoplasm of prostate: Secondary | ICD-10-CM

## 2012-09-23 DIAGNOSIS — Z79899 Other long term (current) drug therapy: Secondary | ICD-10-CM

## 2012-09-29 ENCOUNTER — Other Ambulatory Visit (HOSPITAL_COMMUNITY): Payer: Self-pay | Admitting: Physician Assistant

## 2012-10-01 ENCOUNTER — Ambulatory Visit (INDEPENDENT_AMBULATORY_CARE_PROVIDER_SITE_OTHER): Payer: Medicare HMO | Admitting: Physician Assistant

## 2012-10-01 ENCOUNTER — Encounter (HOSPITAL_COMMUNITY): Payer: Self-pay | Admitting: Physician Assistant

## 2012-10-01 ENCOUNTER — Other Ambulatory Visit (INDEPENDENT_AMBULATORY_CARE_PROVIDER_SITE_OTHER): Payer: 59

## 2012-10-01 DIAGNOSIS — Z13220 Encounter for screening for lipoid disorders: Secondary | ICD-10-CM

## 2012-10-01 DIAGNOSIS — Z125 Encounter for screening for malignant neoplasm of prostate: Secondary | ICD-10-CM

## 2012-10-01 DIAGNOSIS — Z79899 Other long term (current) drug therapy: Secondary | ICD-10-CM

## 2012-10-01 DIAGNOSIS — F200 Paranoid schizophrenia: Secondary | ICD-10-CM

## 2012-10-01 LAB — LIPID PANEL
Cholesterol: 165 mg/dL (ref 0–200)
Triglycerides: 64 mg/dL (ref 0.0–149.0)
VLDL: 12.8 mg/dL (ref 0.0–40.0)

## 2012-10-01 LAB — COMPREHENSIVE METABOLIC PANEL
BUN: 11 mg/dL (ref 6–23)
CO2: 30 mEq/L (ref 19–32)
GFR: 148.26 mL/min (ref >60.00)
Glucose, Bld: 115 mg/dL — ABNORMAL HIGH (ref 70–99)
Sodium: 139 mEq/L (ref 135–145)
Total Bilirubin: 0.4 mg/dL (ref 0.3–1.2)
Total Protein: 7.5 g/dL (ref 6.0–8.3)

## 2012-10-01 MED ORDER — CHLORPROMAZINE HCL 200 MG PO TABS: 200.0000 mg | ORAL_TABLET | Freq: Three times a day (TID) | ORAL | Status: DC
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
## 2012-10-08 ENCOUNTER — Ambulatory Visit (INDEPENDENT_AMBULATORY_CARE_PROVIDER_SITE_OTHER): Payer: 59 | Admitting: Family Medicine

## 2012-10-08 ENCOUNTER — Encounter: Payer: Self-pay | Admitting: Family Medicine

## 2012-10-08 VITALS — BP 126/70 | HR 98 | Temp 97.5°F | Ht 73.0 in | Wt 190.0 lb

## 2012-10-08 DIAGNOSIS — Z1211 Encounter for screening for malignant neoplasm of colon: Secondary | ICD-10-CM

## 2012-10-08 DIAGNOSIS — Z0000 Encounter for general adult medical examination without abnormal findings: Secondary | ICD-10-CM

## 2012-10-08 DIAGNOSIS — F200 Paranoid schizophrenia: Secondary | ICD-10-CM

## 2012-10-08 NOTE — Progress Notes (Signed)
I have personally reviewed the Medicare Annual Wellness questionnaire and have noted 1. The patient's medical and social history 2. Their use of alcohol, tobacco or illicit drugs 3. Their current medications and supplements 4. The patient's functional ability including ADL's, fall risks, home safety risks and hearing or visual             impairment. 5. Diet and physical activities 6. Evidence for depression or mood disorders  The patients weight, height, BMI have been recorded in the chart and visual acuity is per eye clinic.  I have made referrals, counseling and provided education to the patient based review of the above and I have provided the pt with a written personalized care plan for preventive services.  See scanned forms.  Routine anticipatory guidance given to patient.  See health maintenance. Flu d/w pt, encouraged.   Shingles not due PNA not due Tetanus 2009 D/w patient [id number] for colon cancer screening, including IFOB vs. colonoscopy.  Risks and benefits of both were discussed and patient voiced understanding.  Pt elects WUJ:WJXB.  Prostate cancer screening discussed.  PSA wnl.  Advance directive d/w pt.  He would want his brother Jeannett Senior designated if he were incapacitated.  Cognitive function addressed- see scanned forms- and if abnormal then additional documentation follows.   He can concerns about his attention span and I deferred this to psych.   D/w pt about mood.  meds titrated by psych and he is doing well "when I stay clean."  He is trying to make changes in his life to distance himself from cocaine, dealers.  Safe at home.  No SI/HI.  Has been clean a few days.  He can get frustrated but he does better managing this if he is off substance and he recognizes this.   PMH and SH reviewed  Meds, vitals, and allergies reviewed.   ROS: See HPI.  Otherwise negative.    GEN: nad, alert and oriented HEENT: mucous membranes mildly dry NECK: supple w/o LA CV:  rrr. PULM: ctab, no inc wob ABD: soft, +bs EXT: no edema SKIN: no acute rash Speech fluent, not tangential.  No tremor

## 2012-10-08 NOTE — Assessment & Plan Note (Signed)
See scanned forms.  Routine anticipatory guidance given to patient.  See health maintenance. Flu d/w pt, encouraged.   Shingles not due PNA not due Tetanus 2009 D/w patient [id number] for colon cancer screening, including IFOB vs. colonoscopy.  Risks and benefits of both were discussed and patient voiced understanding.  Pt elects WUJ:WJXB.  Prostate cancer screening discussed.  PSA wnl.  Advance directive d/w pt.  He would want his brother Jeannett Senior designated if he were incapacitated.  Cognitive function addressed- see scanned forms- and if abnormal then additional documentation follows.

## 2012-10-08 NOTE — Assessment & Plan Note (Signed)
No changes in meds, d/w pt about sobriety, NA.  He agrees about the goal and is trying to make this happen.  I will defer to psych o/w.  App psych's help.

## 2012-10-08 NOTE — Patient Instructions (Addendum)
I'll notify Elmon Kirschner about your attention concerns.   Take care.  Glad to see you.

## 2012-11-11 ENCOUNTER — Other Ambulatory Visit (INDEPENDENT_AMBULATORY_CARE_PROVIDER_SITE_OTHER): Payer: Medicare HMO

## 2012-11-11 DIAGNOSIS — Z1211 Encounter for screening for malignant neoplasm of colon: Secondary | ICD-10-CM

## 2012-11-12 LAB — FECAL OCCULT BLOOD, IMMUNOCHEMICAL: Fecal Occult Bld: NEGATIVE

## 2012-11-16 ENCOUNTER — Encounter: Payer: Self-pay | Admitting: *Deleted

## 2012-12-07 ENCOUNTER — Ambulatory Visit (HOSPITAL_COMMUNITY): Payer: Self-pay | Admitting: Physician Assistant

## 2012-12-21 ENCOUNTER — Other Ambulatory Visit (HOSPITAL_COMMUNITY): Payer: Self-pay | Admitting: Physician Assistant

## 2012-12-30 ENCOUNTER — Ambulatory Visit (INDEPENDENT_AMBULATORY_CARE_PROVIDER_SITE_OTHER): Payer: Medicare HMO | Admitting: Physician Assistant

## 2012-12-30 DIAGNOSIS — F200 Paranoid schizophrenia: Secondary | ICD-10-CM

## 2012-12-30 NOTE — Progress Notes (Signed)
   [facility] Behavioral Health Follow-up Outpatient Visit  [name] May 08, 1956  Date: 12/30/2012   Subjective: [name] presents today to followup on history that for paranoid schizophrenia. He reports that his current regimen with Thorazine is working out very well. He states that since the Haldol has been discontinued he has recovered his ability to read, and activity which is your important to him. He reports that he forgets to take his Remeron about half the time, and ends up staying up late. He reports that he has had no cocaine for 2 months, and has come to understand how important it is that he remain abstinent from the substance. He continues to spend a significant amount of time at a coffee shop where he maintains a healthy social life. He reports that the Thorazine is rather costly, and he may be looking for another pharmacy to fill his prescriptions. He denies any suicidal or homicidal ideation. He denies any auditory or visual hallucinations. He reports that his paranoia is well managed.  There were no vitals filed for this visit.  Mental Status Examination  Appearance: Well groomed and casually dressed Alert: Yes Attention: good  Cooperative: Yes Eye Contact: Good Speech: Clear and coherent Psychomotor Activity: Normal Memory/Concentration: Intact Oriented: person, place, time/date and situation Mood: Euthymic Affect: Appropriate Thought Processes and Associations: Linear Fund of Knowledge: Good Thought Content: Normal Insight: Good Judgement: Good  Diagnosis: Schizophrenia, paranoid type  Treatment Plan: We will continue his Thorazine 200 mg 3 times daily. We'll also continue his Remeron 15 mg at bedtime and Cogentin 1 mg twice daily. If the Thorazine is cost for hepatitis, we will consider Trilafon. He will return for followup in 2 months  [name], PA-C

## 2013-01-11 ENCOUNTER — Other Ambulatory Visit (HOSPITAL_COMMUNITY): Payer: Self-pay | Admitting: Physician Assistant

## 2013-01-13 ENCOUNTER — Encounter (HOSPITAL_COMMUNITY): Payer: Self-pay | Admitting: Emergency Medicine

## 2013-01-13 ENCOUNTER — Encounter (HOSPITAL_COMMUNITY): Payer: Self-pay | Admitting: *Deleted

## 2013-01-13 ENCOUNTER — Emergency Department (HOSPITAL_COMMUNITY)
Admission: EM | Admit: 2013-01-13 | Discharge: 2013-01-14 | Disposition: A | Discharge status: Other Acute Inpatient Hospital | Payer: Medicare HMO | Source: Home / Self Care | Attending: Emergency Medicine | Admitting: Emergency Medicine

## 2013-01-13 ENCOUNTER — Ambulatory Visit (HOSPITAL_COMMUNITY)
Admission: RE | Admit: 2013-01-13 | Discharge: 2013-01-13 | Disposition: A | Discharge status: Home / Self Care | Payer: Medicare HMO | Attending: Psychiatry | Admitting: Psychiatry

## 2013-01-13 DIAGNOSIS — E871 Hypo-osmolality and hyponatremia: Secondary | ICD-10-CM

## 2013-01-13 DIAGNOSIS — F200 Paranoid schizophrenia: Secondary | ICD-10-CM | POA: Diagnosis not present

## 2013-01-13 DIAGNOSIS — F209 Schizophrenia, unspecified: Secondary | ICD-10-CM

## 2013-01-13 HISTORY — DX: Dorsalgia, unspecified: M54.9

## 2013-01-13 HISTORY — DX: Psoriasis, unspecified: L40.9

## 2013-01-13 NOTE — BH Assessment (Signed)
Assessment Note   [name] is an 57 y.o. male. Pt seen as walk in to [facility]. Reports getting out of the routines at home and missing medications for several days. Pt reports 3 days of being nervous, disorganized, disoriented, poor appetite and poor sleep. Pt is apprehensive and affect is flat. He reports VH of people running, impending threat "might assault me", I have to be hyper alert. Last time he felt this way was 2 years ago when he was hospitalized. Reports occasional alcohol and cocaine use. Last cocaine 6 months ago. Last alcohol 1 beer 01/12/2013. Denies any SI, HI but is hypervigilent. Making tangential and non sequetor statements. My son and I are similar... We are both named [name]... I am [name] and he is [name]. Writer had not asked about son. When asked if son was supportive and where son lived, he said yes but could not decide where he lived other than [facility]. Discussed case with Donell Sievert PAC. Accepted for admission pending medical clearance at [facility] and an appropriate bed at [facility].  Axis I: Schizophrenia Axis II: Deferred Axis III:  Past Medical History  Diagnosis Date  . Schizophrenia   . 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 Substance abuse in remission   . Psoriasis     facial redness and flaking  . Back pain    Axis IV: other psychosocial or environmental problems Axis V: 31-40 impairment in reality testing  Past Medical History:  Past Medical History  Diagnosis Date  . Schizophrenia   . 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 Substance abuse in remission   . Psoriasis     facial redness and flaking  . Back pain     Past Surgical History  Procedure Laterality Date  . Lumbar disc surgery    . Anterior cervical decomp/discectomy fusion  04/27/2012    Procedure: ANTERIOR CERVICAL DECOMPRESSION/DISCECTOMY FUSION 1 LEVEL;  Surgeon: Temple Pacini, MD;  Location: [facility];  Service: Neurosurgery;   Laterality: N/A;  Cervical Five-Six Anterior Cervical Decompression and Fusion    Family History:  Family History  Problem Relation Age of Onset  . Bipolar disorder Brother   . Drug abuse Brother   . Drug abuse Brother   . Bipolar disorder Brother   . Colon cancer Neg Hx   . Prostate cancer Maternal Uncle     Social History:  reports that he has been smoking Cigarettes.  He has a 12 pack-year smoking history. He has quit using smokeless tobacco. He reports that  drinks alcohol. He reports that he uses illicit drugs (Cocaine).  Additional Social History:  Alcohol / Drug Use Pain Medications: denies abuse Prescriptions: denies abuse Over the Counter: denies abuse History of alcohol / drug use?: Yes Substance #1 Name of Substance 1: cocaine 1 - Age of First Use: nos 1 - Amount (size/oz): nos 1 - Frequency: on and off 1 - Duration: years 1 - Last Use / Amount: 6 months ago Substance #2 Name of Substance 2: alcohol 2 - Age of First Use: teens 2 - Amount (size/oz): 1 beer 2 - Frequency: once/month 2 - Duration: years 2 - Last Use / Amount: 01/12/2013  CIWA:   COWS:    Allergies: No Known Allergies  Home Medications:  (Not in a hospital admission)  OB/GYN Status:  No LMP for male patient.  General Assessment Data Location  of Assessment: [facility] Assessment Services Living Arrangements: Alone Can pt return to current living arrangement?: Yes Admission Status: Voluntary Is patient capable of signing voluntary admission?: Yes Transfer from: Home Referral Source: Self/Family/Friend  Education Status Is patient currently in school?: No  Risk to self Suicidal Ideation: No Suicidal Intent: No Is patient at risk for suicide?: Yes Suicidal Plan?: No Access to Means: No What has been your use of drugs/alcohol within the last 12 months?: occasional cocaine and alcohol Previous Attempts/Gestures: No How many times?: 0 Other Self Harm Risks: psychotic Triggers for Past  Attempts:  (psychosis) Intentional Self Injurious Behavior: None Family Suicide History: Unknown Recent stressful life event(s):  (VH feel threatening) Persecutory voices/beliefs?: Yes Depression: No Depression Symptoms: Insomnia Substance abuse history and/or treatment for substance abuse?: Yes Suicide prevention information given to non-admitted patients: Not applicable  Risk to Others Homicidal Ideation: No Thoughts of Harm to Others: No (feels threatened by [facility]) Current Homicidal Intent: No Current Homicidal Plan: No Access to Homicidal Means: No Identified Victim: none History of harm to others?:  (denies) Assessment of Violence: None Noted Does patient have access to weapons?: No Criminal Charges Pending?: No Does patient have a court date: No  Psychosis Hallucinations: Visual (people running, might assault him) Delusions: Persecutory (impending threat)  Mental Status Report Appear/Hygiene: Other (Comment) (unremarkable) Eye Contact: Good Motor Activity: Psychomotor retardation Speech: Tangential;Soft Level of Consciousness: Alert Mood: Apprehensive Affect: Apprehensive;Blunted Anxiety Level: Moderate Thought Processes: Circumstantial;Tangential Judgement: Impaired Orientation: Person;Situation ( thought it was April 22) Obsessive Compulsive Thoughts/Behaviors: None  Cognitive Functioning Concentration: Decreased Memory: Remote Intact;Recent Impaired IQ: Average Insight: Poor Impulse Control: Fair Appetite: Poor Weight Loss: 0 (denies) Weight Gain: 0 Sleep: Decreased Total Hours of Sleep: 2 (past 3 days) Vegetative Symptoms: None  ADLScreening [facility] Assessment Services) Patient's cognitive ability adequate to safely complete daily activities?: Yes Patient able to express need for assistance with ADLs?: Yes  Abuse/Neglect [facility]) Physical Abuse: Denies Verbal Abuse: Denies Sexual Abuse: Denies  Prior Inpatient Therapy Prior Inpatient Therapy:  Yes Prior Therapy Dates: 2012 Prior Therapy Facilty/Provider(s): [facility] Reason for Treatment: schizophrenia  Prior Outpatient Therapy Prior Outpatient Therapy: Yes Prior Therapy Dates: current Prior Therapy Facilty/Provider(s): [facility] Reason for Treatment: schizophrenia  ADL Screening (condition at time of admission) Patient's cognitive ability adequate to safely complete daily activities?: Yes Patient able to express need for assistance with ADLs?: Yes Weakness of Legs: None Weakness of Arms/Hands: None  Home Assistive Devices/Equipment Home Assistive Devices/Equipment: None    Abuse/Neglect Assessment (Assessment to be complete while patient is alone) Physical Abuse: Denies Verbal Abuse: Denies Sexual Abuse: Denies Exploitation of patient/patient's resources: Denies Self-Neglect: Denies     Merchant navy officer (For Healthcare) Advance Directive: Patient does not have advance directive Pre-existing out of facility DNR order (yellow form or pink MOST form): No Nutrition Screen- [facility] Adult/[facility]/AP Have you recently lost weight without trying?: No Have you been eating poorly because of a decreased appetite?: Yes Malnutrition Screening Tool Score: 1  Additional Information 1:1 In Past 12 Months?: No CIRT Risk: Yes (unclear) Elopement Risk: No Does patient have medical clearance?: No     Disposition:  Disposition Initial Assessment Completed for this Encounter: Yes Disposition of Patient: Other dispositions Other disposition(s): Other (Comment) ([facility] medical clearence)  On Site Evaluation by:   Reviewed with Physician:     Conan Bowens 01/13/2013 11:40 PM

## 2013-01-13 NOTE — ED Notes (Signed)
Pt sent from [facility] for medical clearance. Pt states he is confused, slow to answer questions. States yes and no when ask if he is taking meds. Denies SI/HI. Pt states unable to sleep due to being so "tense". Pt confirms he is hearing voices, telling him to make telephone calls to different people.

## 2013-01-14 ENCOUNTER — Inpatient Hospital Stay (HOSPITAL_COMMUNITY)
Admission: EM | Admit: 2013-01-14 | Discharge: 2013-01-18 | DRG: 885 | Disposition: A | Discharge status: Home / Self Care | Payer: Medicare HMO | Source: Intra-hospital | Attending: Psychiatry | Admitting: Psychiatry

## 2013-01-14 ENCOUNTER — Emergency Department (HOSPITAL_COMMUNITY): Payer: Medicare HMO

## 2013-01-14 ENCOUNTER — Encounter (HOSPITAL_COMMUNITY): Payer: Self-pay | Admitting: *Deleted

## 2013-01-14 DIAGNOSIS — F200 Paranoid schizophrenia: Secondary | ICD-10-CM | POA: Diagnosis present

## 2013-01-14 DIAGNOSIS — Z79899 Other long term (current) drug therapy: Secondary | ICD-10-CM

## 2013-01-14 LAB — URINALYSIS, ROUTINE W REFLEX MICROSCOPIC
Glucose, UA: NEGATIVE mg/dL
Leukocytes, UA: NEGATIVE
Protein, ur: NEGATIVE mg/dL
Specific Gravity, Urine: 1.016 (ref 1.005–1.030)

## 2013-01-14 LAB — COMPREHENSIVE METABOLIC PANEL
ALT: 41 U/L (ref 0–53)
Alkaline Phosphatase: 110 U/L (ref 39–117)
CO2: 27 mEq/L (ref 19–32)
GFR calc Af Amer: >90 mL/min (ref >90)
GFR calc non Af Amer: >90 mL/min (ref >90)
Glucose, Bld: 113 mg/dL — ABNORMAL HIGH (ref 70–99)
Potassium: 3.7 mEq/L (ref 3.5–5.1)
Sodium: 127 mEq/L — ABNORMAL LOW (ref 135–145)

## 2013-01-14 LAB — CBC
Hemoglobin: 14.2 g/dL (ref 13.0–17.0)
MCH: 32.6 pg (ref 26.0–34.0)
RBC: 4.36 MIL/uL (ref 4.22–5.81)

## 2013-01-14 LAB — POCT I-STAT, CHEM 8
BUN: 8 mg/dL (ref 6–23)
Calcium, Ion: 1.11 mmol/L — ABNORMAL LOW (ref 1.12–1.23)
Chloride: 99 mEq/L (ref 96–112)
Creatinine, Ser: 0.5 mg/dL (ref 0.50–1.35)
Glucose, Bld: 105 mg/dL — ABNORMAL HIGH (ref 70–99)
HCT: 37 % — ABNORMAL LOW (ref 39.0–52.0)
Hemoglobin: 12.6 g/dL — ABNORMAL LOW (ref 13.0–17.0)
Potassium: 3.7 mEq/L (ref 3.5–5.1)
Sodium: 134 mEq/L — ABNORMAL LOW (ref 135–145)
TCO2: 24 mmol/L (ref 0–100)

## 2013-01-14 LAB — ACETAMINOPHEN LEVEL: Acetaminophen (Tylenol), Serum: <15 ug/mL (ref 10–30)

## 2013-01-14 LAB — RAPID URINE DRUG SCREEN, HOSP PERFORMED
Barbiturates: NOT DETECTED
Tetrahydrocannabinol: NOT DETECTED

## 2013-01-14 MED ORDER — MIRTAZAPINE 15 MG PO TABS
15.0000 mg | ORAL_TABLET | Freq: Every day | ORAL | Status: DC
  Administered 2013-01-14 – 2013-01-17 (×3): 15 mg via ORAL
  Filled 2013-01-14 (×6): qty 1

## 2013-01-14 MED ORDER — ONDANSETRON HCL 4 MG PO TABS: 4.0000 mg | ORAL_TABLET | Freq: Three times a day (TID) | ORAL | Status: DC | PRN

## 2013-01-14 MED ORDER — ACETAMINOPHEN 325 MG PO TABS
650.0000 mg | ORAL_TABLET | Freq: Four times a day (QID) | ORAL | Status: DC | PRN
  Administered 2013-01-14: 650 mg via ORAL

## 2013-01-14 MED ORDER — SODIUM CHLORIDE 0.9 % IV SOLN
Freq: Once | INTRAVENOUS | Status: AC
  Administered 2013-01-14: 03:00:00 via INTRAVENOUS

## 2013-01-14 MED ORDER — SODIUM CHLORIDE 1 G PO TABS
1.0000 g | ORAL_TABLET | Freq: Once | ORAL | Status: AC
  Administered 2013-01-14: 1 g via ORAL
  Filled 2013-01-14: qty 1

## 2013-01-14 MED ORDER — ZOLPIDEM TARTRATE 5 MG PO TABS: 5.0000 mg | ORAL_TABLET | Freq: Every evening | ORAL | Status: DC | PRN

## 2013-01-14 MED ORDER — PNEUMOCOCCAL VAC POLYVALENT 25 MCG/0.5ML IJ INJ: 0.5000 mL | INJECTION | INTRAMUSCULAR | Status: AC

## 2013-01-14 MED ORDER — IBUPROFEN 600 MG PO TABS: 600.0000 mg | ORAL_TABLET | Freq: Three times a day (TID) | ORAL | Status: DC | PRN

## 2013-01-14 MED ORDER — LORAZEPAM 1 MG PO TABS: 1.0000 mg | ORAL_TABLET | Freq: Three times a day (TID) | ORAL | Status: DC | PRN

## 2013-01-14 MED ORDER — SALSALATE 500 MG PO TABS
1000.0000 mg | ORAL_TABLET | Freq: Once | ORAL | Status: AC
  Administered 2013-01-14: 1000 mg via ORAL
  Filled 2013-01-14: qty 2

## 2013-01-14 MED ORDER — ALUM & MAG HYDROXIDE-SIMETH 200-200-20 MG/5ML PO SUSP: 30.0000 mL | ORAL | Status: DC | PRN

## 2013-01-14 MED ORDER — FLUPHENAZINE HCL 1 MG PO TABS
1.0000 mg | ORAL_TABLET | Freq: Two times a day (BID) | ORAL | Status: DC
  Administered 2013-01-14: 1 mg via ORAL
  Filled 2013-01-14 (×3): qty 1

## 2013-01-14 MED ORDER — ACETAMINOPHEN 325 MG PO TABS: 650.0000 mg | ORAL_TABLET | ORAL | Status: DC | PRN

## 2013-01-14 MED ORDER — MIRTAZAPINE 30 MG PO TABS
15.0000 mg | ORAL_TABLET | Freq: Every day | ORAL | Status: DC
  Administered 2013-01-14: 15 mg via ORAL
  Filled 2013-01-14: qty 1

## 2013-01-14 MED ORDER — MAGNESIUM HYDROXIDE 400 MG/5ML PO SUSP: 30.0000 mL | Freq: Every day | ORAL | Status: DC | PRN

## 2013-01-14 NOTE — ED Provider Notes (Signed)
History     CSN: [id number]  Arrival date & time 01/13/13  2320   First MD Initiated Contact with Patient 01/14/13 0014      Chief Complaint  Patient presents with  . Medical Clearance    (Consider location/radiation/quality/duration/timing/severity/associated sxs/prior treatment) HPI [name] is a 57 y.o. male who presents to ED with complaint of taking only partial dose of his medication for the last several days, missing some doses, and now he states he is having problems with concentration, hallucinations, unable to organize thoughts. States has not slept in 3 days. States lives by himself. States called the ambulance and was brought to [facility] who sent pt here for further evaluation. Denies SI or HI. Poor historian.  Past Medical History  Diagnosis Date  . Schizophrenia   . 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 Substance abuse in remission   . Psoriasis     facial redness and flaking  . Back pain     Past Surgical History  Procedure Laterality Date  . Lumbar disc surgery    . Anterior cervical decomp/discectomy fusion  04/27/2012    Procedure: ANTERIOR CERVICAL DECOMPRESSION/DISCECTOMY FUSION 1 LEVEL;  Surgeon: Temple Pacini, MD;  Location: [facility];  Service: Neurosurgery;  Laterality: N/A;  Cervical Five-Six Anterior Cervical Decompression and Fusion    Family History  Problem Relation Age of Onset  . Bipolar disorder Brother   . Drug abuse Brother   . Drug abuse Brother   . Bipolar disorder Brother   . Colon cancer Neg Hx   . Prostate cancer Maternal Uncle     History  Substance Use Topics  . Smoking status: Current Every Day Smoker -- 1.00 packs/day for 12 years    Types: Cigarettes  . Smokeless tobacco: Former Neurosurgeon  . Alcohol Use: Yes     Comment: rarely      Review of Systems  Constitutional: Negative for fever and chills.  Respiratory: Negative.   Cardiovascular: Negative.   Genitourinary: Negative for dysuria and flank  pain.  Neurological: Negative for dizziness, weakness and headaches.  Psychiatric/Behavioral: Positive for hallucinations, confusion, sleep disturbance, dysphoric mood and decreased concentration. Negative for suicidal ideas.    Allergies  Review of patient's allergies indicates no known allergies.  Home Medications   Current Outpatient Rx  Name  Route  Sig  Dispense  Refill  . benztropine (COGENTIN) 1 MG tablet      TAKE 1 TABLET (1 MG TOTAL) BY MOUTH 2 (TWO) TIMES DAILY.   60 tablet   2   . calcium carbonate (OS-CAL) 600 MG TABS   Oral   Take 600 mg by mouth 2 (two) times daily with a meal.         . cholecalciferol (VITAMIN D) 1000 UNITS tablet   Oral   Take 1,000 Units by mouth daily.         . mirtazapine (REMERON) 15 MG tablet   Oral   Take 1 tablet (15 mg total) by mouth at bedtime.   30 tablet   1     BP 149/91  Pulse 92  Temp(Src) 98.5 F (36.9 C) (Oral)  Resp 19  Ht 6\' 2"  (1.88 m)  Wt 200 lb (90.719 kg)  BMI 25.67 kg/m2  SpO2 100%  Physical Exam  Nursing note and vitals reviewed. Constitutional: He is oriented to person, place, and time. He appears well-developed and well-nourished. No distress.  HENT:  Head: Normocephalic.  Eyes: Conjunctivae are normal.  Neck: Neck supple.  Cardiovascular: Normal rate, regular rhythm and normal heart sounds.   Pulmonary/Chest: Effort normal and breath sounds normal. No respiratory distress. He has no wheezes. He has no rales.  Abdominal: Soft. Bowel sounds are normal. He exhibits no distension. There is no tenderness. There is no rebound.  Neurological: He is alert and oriented to person, place, and time.  Skin: Skin is warm and dry.  Psychiatric: His speech is delayed and tangential. He is slowed. Cognition and memory are impaired. He exhibits a depressed mood.  Pt with flat affect, slow to respond. He is oriented x4, however thought process tangential at times. Pt was having a conversation with me but  suddenly started telling me about a dog that he saw earlier.     ED Course  Procedures (including critical care time)  1:05 AM Pt seen and examined. Pt  With tangential speech, flat affect, slow speech. Slow movements. Will do labs, ct head due to abnormal neuro exam. Medial clearance labs. Act consult.   Results for orders placed during the hospital encounter of 01/13/13  ACETAMINOPHEN LEVEL      Result Value Range   Acetaminophen (Tylenol), Serum <15.0  10 - 30 ug/mL  CBC      Result Value Range   WBC 8.3  4.0 - 10.5 K/uL   RBC 4.36  4.22 - 5.81 MIL/uL   Hemoglobin 14.2  13.0 - 17.0 g/dL   HCT 47.8 (*) 29.5 - 62.1 %   MCV 88.1  78.0 - 100.0 fL   MCH 32.6  26.0 - 34.0 pg   MCHC 37.0 (*) 30.0 - 36.0 g/dL   RDW 30.8  65.7 - 84.6 %   Platelets 248  150 - 400 K/uL  COMPREHENSIVE METABOLIC PANEL      Result Value Range   Sodium 127 (*) 135 - 145 mEq/L   Potassium 3.7  3.5 - 5.1 mEq/L   Chloride 90 (*) 96 - 112 mEq/L   CO2 27  19 - 32 mEq/L   Glucose, Bld 113 (*) 70 - 99 mg/dL   BUN 13  6 - 23 mg/dL   Creatinine, Ser 9.62  0.50 - 1.35 mg/dL   Calcium 9.2  8.4 - 95.2 mg/dL   Total Protein 7.7  6.0 - 8.3 g/dL   Albumin 3.9  3.5 - 5.2 g/dL   AST 57 (*) 0 - 37 U/L   ALT 41  0 - 53 U/L   Alkaline Phosphatase 110  39 - 117 U/L   Total Bilirubin 0.5  0.3 - 1.2 mg/dL   GFR calc non Af Amer >90  >90 mL/min   GFR calc Af Amer >90  >90 mL/min  ETHANOL      Result Value Range   Alcohol, Ethyl (B) <11  0 - 11 mg/dL  SALICYLATE LEVEL      Result Value Range   Salicylate Lvl <2.0 (*) 2.8 - 20.0 mg/dL   Ct Head Wo Contrast  01/14/2013  *RADIOLOGY REPORT*  Clinical Data: 57 year old male with altered mental status, confusion.  History of schizophrenia.  CT HEAD WITHOUT CONTRAST  Technique:  Contiguous axial images were obtained from the base of the skull through the vertex without contrast.  Comparison: 04/16/2011.  Findings: Visualized orbits and scalp soft tissues are within normal  limits.  No acute osseous abnormality identified. Visualized paranasal sinuses and mastoids are clear.  Cerebral volume is stable and within normal  limits.  No midline shift, ventriculomegaly, mass effect, evidence of mass lesion, intracranial hemorrhage or evidence of cortically based acute infarction.  Gray-white matter differentiation is within normal limits throughout the brain. Intracranial vascular hyperdensity is stable compared to the prior.  IMPRESSION: Stable and normal noncontrast CT appearance of the brain.   Original Report Authenticated By: Erskine Speed, M.D.     2:21 AM Sodium 127, will do bolus of IV saline. Spoke with ACT. Pt already been assessed. Waiting on medical clearance. Recheck IStat in AM. Signed out to Dr. Rulon Abide at shift change     No results found.   1. Schizophrenia   2. Hyponatremia       MDM            Lottie Mussel, PA-C 01/14/13 2952

## 2013-01-14 NOTE — ED Provider Notes (Signed)
Medical screening examination/treatment/procedure(s) were performed by non-physician practitioner and as supervising physician I was immediately available for consultation/collaboration.  John-Adam Brady Plant, M.D.   John-Adam Thuy Atilano, MD 01/14/13 0635 

## 2013-01-14 NOTE — Tx Team (Signed)
Initial Interdisciplinary Treatment Plan  PATIENT STRENGTHS: (choose at least two) Ability for insight Average or above average intelligence  PATIENT STRESSORS: Medication change or noncompliance   PROBLEM LIST: Problem List/Patient Goals Date to be addressed Date deferred Reason deferred Estimated date of resolution  schizophrenia 01/14/13                                                      DISCHARGE CRITERIA:  Improved stabilization in mood, thinking, and/or behavior Safe-care adequate arrangements made  PRELIMINARY DISCHARGE PLAN: Return to previous living arrangement  PATIENT/FAMIILY INVOLVEMENT: This treatment plan has been presented to and reviewed with the patient, [name], and/or family member, .  The patient and family have been given the opportunity to ask questions and make suggestions.  [name], [place] 01/14/2013, 6:31 PM

## 2013-01-14 NOTE — ED Provider Notes (Signed)
Assuming care of patient this morning. Patient in the ED for psychoses. Awaiting placement. Workup thus far is negative. Sodium is 134 today - 127 yday - which had prevented an acceptance. Patient had no complains, no concerns from the nursing side. Will continue to monitor.  Filed Vitals:   01/14/13 0623  BP: 166/84  Pulse: 83  Temp: 98.7 F (37.1 C)  Resp: 18      Derwood Kaplan, MD 01/14/13 [phone]

## 2013-01-14 NOTE — Progress Notes (Signed)
57 year old male admitted on voluntary basis. Pt reports that he stopped taking his medications for a few days because he thought he would be able to think more clearly but states that he became confused and starting seeing things. Pt brought himself in here to be able to get back on his medications. Pt denies any current substance abuse history and spoke about being clean for awhile now. Pt denies SI and able to contract for safety on the unit. Pt reports that he goes to outpatient and has been going there on a regular basis. Pt reports that he is suppose to be taking thorazine. Pt reports that he resides at [facility] and has plans to return there after discharge. Pt was oriented to the unit and safety maintained.

## 2013-01-14 NOTE — ED Notes (Signed)
CIwa completed at 1100 charted on the wrong patient.

## 2013-01-14 NOTE — BHH Counselor (Signed)
Patient accepted at [facility] by Nadean Corwin, NP to Dr. Geoffery Lyons. Patient's room assignment is 400-1. EDP-Dr. Shyrl Numbers notified of patients disposition. Patient's nurse Velna Hatchet also notified. The nursing report number is [phone].

## 2013-01-14 NOTE — BH Assessment (Signed)
[facility] Assessment Progress Note      Consulted with Verne Spurr PA re admission for this patient from [facility]. She has accepted him when there is a bed on the 400 unit. There are discharges from the 400 unit later today.

## 2013-01-14 NOTE — ED Provider Notes (Signed)
ACT concerned that the patient's mild hyponatremia would preclude him from moving to behavioral health, patient was given food and salt tablets, patient's hyponatremia has corrected, sodium currently 134, and the patient to psych ED to await placement.  Medically clear.    Jones Skene, MD 01/14/13 0730

## 2013-01-14 NOTE — ED Notes (Signed)
States he is here to get his schizophrenia straightened out,has been off meds, does not hear voices but sees shadows at times, states he is depressed, easy to talk to and is cooperative w/staff in answering questions/information. Denies Si or HI.

## 2013-01-14 NOTE — ED Notes (Signed)
Called report to [facility] and transported to [facility] by security w/2 bags belongings

## 2013-01-15 ENCOUNTER — Encounter (HOSPITAL_COMMUNITY): Payer: Self-pay | Admitting: Psychiatry

## 2013-01-15 MED ORDER — CHLORPROMAZINE HCL 100 MG PO TABS
200.0000 mg | ORAL_TABLET | Freq: Two times a day (BID) | ORAL | Status: DC
  Administered 2013-01-15 – 2013-01-18 (×6): 200 mg via ORAL
  Filled 2013-01-15 (×8): qty 2

## 2013-01-15 MED ORDER — VITAMIN D3 25 MCG (1000 UNIT) PO TABS
1000.0000 [IU] | ORAL_TABLET | Freq: Every day | ORAL | Status: DC
  Filled 2013-01-15 (×5): qty 1

## 2013-01-15 MED ORDER — CALCIUM CARBONATE 1250 (500 CA) MG PO TABS
1.0000 | ORAL_TABLET | Freq: Two times a day (BID) | ORAL | Status: DC
  Administered 2013-01-15 – 2013-01-18 (×6): 500 mg via ORAL
  Filled 2013-01-15 (×8): qty 1

## 2013-01-15 MED ORDER — CALCIUM CARBONATE 600 MG PO TABS: 600.0000 mg | ORAL_TABLET | Freq: Two times a day (BID) | ORAL | Status: DC

## 2013-01-15 NOTE — BHH Group Notes (Signed)
[facility] LCSW Aftercare Discharge Planning Group Note   01/15/2013 2:26 PM  Participation Quality:  Guarded  Mood/Affect:  Flat  Depression Rating:  6  Anxiety Rating:  3  Thoughts of Suicide:  No Will you contract for safety?   NA  Current AVH:  No  Plan for Discharge/Comments:  [name] states he quit taking one of his meds earlier this week, and he started seeing shadows and his paranoia increased.  His landlord helped him get her.  Follows up with [employer].  Transportation Means: landord  Supports: landlord  [place], [place]

## 2013-01-15 NOTE — Progress Notes (Signed)
Patient ID: [name], male   DOB: 12-Nov-1955, 57 y.o.   MRN: [id number] D: pt. In bed most of the evening, does get up for meds, pt. Forward little, brief eye contact. A: Writer introduced self to client. Staff will monitor q8min for safety. Pt.encouraged group. R: Pt. Is safe on the unit. Pt. Did not attend karaoke.

## 2013-01-15 NOTE — Progress Notes (Signed)
Adult Psychoeducational Group Note  Date:  01/15/2013 Time:  11:00AM Group Topic/Focus:  Relapse Prevention Planning:   The focus of this group is to define relapse and discuss the need for planning to combat relapse.  Participation Level:  Active  Participation Quality:  Appropriate and Attentive  Affect:  Appropriate  Cognitive:  Alert and Appropriate  Insight: Appropriate  Engagement in Group:  Engaged  Modes of Intervention:  Discussion  Additional Comments:  Pt. Was attentive and appropriate during today's group discussion. Pt. Was able to share positive coping skills he can use once discharged. Pt. Stated that he enjoys fishing and playing basket ball. Pt. Stated he is learning how to build a positive support system.   Bing Plume D 01/15/2013, 12:01 PM

## 2013-01-15 NOTE — BHH Counselor (Signed)
Adult Comprehensive Assessment  Patient ID: [name], male   DOB: 1955/09/13, 57 y.o.   MRN: [id number]  Information Source: Information source: Patient  Current Stressors:  Educational / Learning stressors: N/A Employment / Job issues: N/A Family Relationships: N/A Surveyor, quantity / Lack of resources (include bankruptcy): N/A Housing / Lack of housing: N/A Physical health (include injuries & life threatening diseases): N/A Social relationships: Yes  Few Substance abuse: Yes  Drank beer prior to admission Bereavement / Loss: N/A  Living/Environment/Situation:  Living Arrangements: Alone Living conditions (as described by patient or guardian): OK How long has patient lived in current situation?: 5years What is atmosphere in current home: Comfortable  Family History:  Marital status: Single Does patient have children?: Yes How many children?: 2 How is patient's relationship with their children?: good with son  Childhood History:  By whom was/is the patient raised?: Both parents Description of patient's relationship with caregiver when they were a child: good Patient's description of current relationship with people who raised him/her: father deceased,  OK with mom Does patient have siblings?: Yes Number of Siblings: 5 Description of patient's current relationship with siblings: close to brother next to him in age Did patient suffer any verbal/emotional/physical/sexual abuse as a child?: No Did patient suffer from severe childhood neglect?: No Has patient ever been sexually abused/assaulted/raped as an adolescent or adult?: No Was the patient ever a victim of a crime or a disaster?: No Witnessed domestic violence?: No Has patient been effected by domestic violence as an adult?: No  Education:  Highest grade of school patient has completed: law degree Currently a student?: No Learning disability?: No  Employment/Work Situation:   Employment situation: On disability Why  is patient on disability: mental health How long has patient been on disability: 22 years Patient's job has been impacted by current illness: No What is the longest time patient has a held a job?: 10 yrs Where was the patient employed at that time?: [employer] Has patient ever been in the [employer]?: No Has patient ever served in Buyer, retail?: No  Financial Resources:      Alcohol/Substance Abuse:      Social Support System:   Forensic psychologist System: Poor Describe Community Support System: landlord Type of faith/religion: Christian How does patient's faith help to cope with current illness?: Prayer givew me hope  Leisure/Recreation:   Leisure and Hobbies: walk, bowl  Strengths/Needs:   In what areas does patient struggle / problems for patient: socialization  Discharge Plan:   Does patient have access to transportation?: Yes Will patient be returning to same living situation after discharge?: Yes Currently receiving community mental health services: Yes (From Whom) ([facility] outpt) Does patient have financial barriers related to discharge medications?: No  Summary/Recommendations:   Summary and Recommendations (to be completed by the evaluator): [name] is a 57 YO Caucasian male diagnosed with schizophrenia who became symptomatic after stopping one of his meds earlier this week and drinking some beer.  He can benefit from crises stabilization, medication managment, therapeutic milieu and coordination of outpt services.  Mark Gerald B. 01/15/2013

## 2013-01-15 NOTE — Tx Team (Signed)
  Interdisciplinary Treatment Plan Update   Date Reviewed:  01/15/2013  Time Reviewed:  8:09 AM  Progress in Treatment:   Attending groups: Yes Participating in groups: Yes Taking medication as prescribed: Yes  Tolerating medication: Yes Family/Significant other contact made: No  Patient understands diagnosis: Yes As evidenced by asking for help getting back on medications that reduce psychosis Discussing patient identified problems/goals with staff: Yes Medical problems stabilized or resolved: Yes Denies suicidal/homicidal ideation: Yes  In tx team Patient has not harmed self or others: Yes  For review of initial/current patient goals, please see plan of care.  Estimated Length of Stay:  3-5 days  Reason for Continuation of Hospitalization: Depression Hallucinations Medication stabilization  New Problems/Goals identified:  N/A  Discharge Plan or Barriers:   return home, follow up outp  Additional Comments:  Patient is a 57 year old man, divorced, unemployed attorney with a long history of Schizophrenia. Patient reports that he got out of the routines at home and missing medications for several days. He reports 4 days of being anxious, nervous, feeling paranoid, irritable, disorganized, disoriented, seeing shadows and difficulty sleeping. He also reports seeing people running, and feeling the impending threat of being assaulted and has to stayed up all night to prevent that. Last time he felt this way was 2 years ago when he was hospitalized at [facility] behavioral health. Reports occasional alcohol and cocaine use. Last cocaine use was 6 months ago. Last alcohol 1 beer on 01/12/2013. Currently, patient denies suicidal/homicidal ideation, intent and plan.    Attendees:  Signature: Thedore Mins, MD 01/15/2013 8:09 AM   Signature: Richelle Ito, LCSW 01/15/2013 8:09 AM  Signature: Verne Spurr, PA 01/15/2013 8:09 AM  Signature: Joslyn Devon, RN 01/15/2013 8:09 AM  Signature: Lamount Cranker,  RN 01/15/2013 8:09 AM  Signature:  01/15/2013 8:09 AM  Signature:   01/15/2013 8:09 AM  Signature:    Signature:    Signature:    Signature:    Signature:    Signature:      Scribe for Treatment Team:   Richelle Ito, LCSW  01/15/2013 8:09 AM

## 2013-01-15 NOTE — BHH Suicide Risk Assessment (Signed)
Suicide Risk Assessment  Admission Assessment     Nursing information obtained from:  Patient Demographic factors:  Male;Caucasian;Living alone;Unemployed Current Mental Status:  NA Loss Factors:  NA Historical Factors:  Family history of mental illness or substance abuse Risk Reduction Factors:  Sense of responsibility to family;Positive social support  CLINICAL FACTORS:   Alcohol/Substance Abuse/Dependencies Schizophrenia:   Paranoid or undifferentiated type Currently Psychotic Previous Psychiatric Diagnoses and Treatments  COGNITIVE FEATURES THAT CONTRIBUTE TO RISK:  Closed-mindedness Polarized thinking    SUICIDE RISK:   Minimal: No identifiable suicidal ideation.  Patients presenting with no risk factors but with morbid ruminations; may be classified as minimal risk based on the severity of the depressive symptoms  PLAN OF CARE:1. Admit for crisis management and stabilization. 2. Medication management to reduce current symptoms to base line and improve the  patient's overall level of functioning 3. Treat health problems as indicated. 4. Develop treatment plan to decrease risk of relapse upon discharge and the need for readmission. 5. Psycho-social education regarding relapse prevention and self care. 6. Health care follow up as needed for medical problems. 7. Restart home medications where appropriate.   I certify that inpatient services furnished can reasonably be expected to improve the patient's condition.  Eliyanah Elgersma,MD 01/15/2013, 10:53 AM

## 2013-01-15 NOTE — Progress Notes (Signed)
D) Pt is paranoid. Refused his vitamin D and pneumonia vaccine. Affect is flat and mood depressed. Will go to the group and then will get up and leave. Does not interact freely. A) Given support and reassurance along with praise. Given encouragement to attend the groups R) Pt remains paranoid.

## 2013-01-15 NOTE — Progress Notes (Signed)
D) Pt refused his pneumonia and vitamin D today. States "I don't want it". Affect is intense with poor eye contact. Denies auditory and visual hallucinations. Appears paranoid. Will go to the groups but then wanders out. A) Given support, Frequent short interactions with Pt.  R) Remains paranoid.

## 2013-01-15 NOTE — Progress Notes (Signed)
Adult Psychoeducational Group Note  Date:  01/15/2013 Time:  8:30PM Group Topic/Focus:  Wrap-Up Group:   The focus of this group is to help patients review their daily goal of treatment and discuss progress on daily workbooks.  Participation Level:  Did Not Attend  Additional Comments: Pt. Didn't attend group.   Bing Plume D 01/15/2013, 9:09 PM
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
## 2013-01-15 NOTE — Progress Notes (Signed)
Recreation Therapy Notes  Date: 05.09.2014 Time: 9:30am Location: [address] Dayroom      Group Topic/Focus: Leisure Education  Participation Level: None  Participation Quality: N/A  Affect: Flat  Cognitive: Oriented  Additional Comments: Activity: Roll of the Dice ; Explanation: Patients rolled a large di. If the patient rolled a 1 - 3 patient was asked to draw a recreation activity. If the patient rolled a 4-6 patient was asked to act out a recreation activity. Patient selected slips of paper from a container. If the slip of paper had an activity listed on it patient acted out or drew the activity on the white board. If the slip of paper had a letter of the alphabet on it patient was asked to identify an activity they enjoy participating in and act out or draw the activity on the white board. .   Patient was asked to leave group session at approximately 9:40am by MHT. Patient returned to group after approximately 5 minutes, but did not stay in group session. Patient exited group session and did not return to day room.   Marykay Lex Teondre Jarosz, LRT/CTRS  Oneda Duffett L 01/15/2013 12:09 PM

## 2013-01-16 DIAGNOSIS — F200 Paranoid schizophrenia: Principal | ICD-10-CM

## 2013-01-16 MED ORDER — LISINOPRIL 5 MG PO TABS
5.0000 mg | ORAL_TABLET | Freq: Every day | ORAL | Status: DC
  Administered 2013-01-16 – 2013-01-17 (×2): 5 mg via ORAL
  Filled 2013-01-16 (×4): qty 1

## 2013-01-16 MED ORDER — BENZTROPINE MESYLATE 1 MG PO TABS
1.0000 mg | ORAL_TABLET | Freq: Two times a day (BID) | ORAL | Status: DC
  Administered 2013-01-16 – 2013-01-18 (×4): 1 mg via ORAL
  Filled 2013-01-16 (×7): qty 1

## 2013-01-16 MED ORDER — BENZTROPINE MESYLATE 1 MG PO TABS
1.0000 mg | ORAL_TABLET | ORAL | Status: AC
  Administered 2013-01-16: 1 mg via ORAL
  Filled 2013-01-16: qty 1

## 2013-01-16 NOTE — Progress Notes (Signed)
D) Pt remains paranoid and refused some of his medications today. States "I am not taking them". Attempted to fill out his packet today for Saturday  that is given to each patient and was unable to complete, however, Pt did come and hand it to me, as if it was completed. Has poor eye contact and cannot tolerate staying in the milieu long periods of time. A) Provided with short contacts and given support and reassurance as needed.  R) Pt remains paranoid. Rates his depression at a 4 and his hopelessness is not rated. Writes that he is having thoughts of SI on and off.

## 2013-01-16 NOTE — Progress Notes (Signed)
The focus of this group is to help patients review their daily goal of treatment and discuss progress on daily workbooks. Pt attended the evening group session and responded to all discussion prompts. Pt reported having a good day today, the highlight of which was being able to go to the gym. Pt said that while he did not participate in any of the activities there, the change of scenery was nice. Pt appeared calm and engaged in the group session, smiling several times.

## 2013-01-16 NOTE — Progress Notes (Signed)
D.  Pt pleasant but guarded on approach, denied complaints at this time.  Positive for evening group and participated appropriately, see group notes. Denies SI/HI/hallucinations at this time.  Minimal interaction. Took medication as ordered  A.  Support and encouragement offered  R.  Pt remains safe on unit, will continue to monitor.

## 2013-01-16 NOTE — [facility] Group Notes (Signed)
[facility] Group Notes:  (Clinical Social Work)  01/16/2013  11:15-11:45AM  Summary of Progress/Problems:   The main focus of today's process group was for the patient to identify ways in which they have in the past sabotaged their own recovery and reasons they may have done this/what they received from doing it.  We then worked to identify a specific plan to avoid doing this when discharged from the hospital for this admission.  The patient expressed that he has kept a hypodermic needle in his room, telling himself he may need it for medical purposes, but in fact he relapses with it.  He stated that what he needs to do is "stay away from people and don't pick up their bad habits."  He spoke in short bursts at times when others were talking interjecting inappropriately.  At one point, he stated at an appropriate time when another patient was talking about having addiction issues, "There's always a nickle down the street willing to undersell a dime."  Type of Therapy:  Group Therapy - Process  Participation Level:  Active  Participation Quality:  Attentive, Intrusive, Redirectable and Sharing  Affect:  Depressed and Flat  Cognitive:  Disorganized  Insight:  Improving  Engagement in Therapy:  Improving  Modes of Intervention:  Clarification, Education, Limit-setting, Problem-solving, Socialization, Support and Processing, Exploration, Discussion   Mark Mantle, LCSW 01/16/2013, 12:41 PM

## 2013-01-16 NOTE — Progress Notes (Signed)
I agree with this note. I certify that inpatient services furnished can reasonably be expected to improve the patient's condition. Lateesha Bezold, MD, MSP[facility]  

## 2013-01-16 NOTE — Progress Notes (Signed)
Patient ID: [name], male   DOB: March 03, 1956, 57 y.o.   MRN: [id number] D. The patient stayed in his room in bed most of the evening. He refused his HS medication, stating he was only going to take "emergency medication". Turned away and would not explain further. His roommate became quite upset and requested a room change stating that [name] kept coming into the bathroom when he was in there. [name] could neither confirm or deny the allegation.  A. Encouraged the patient to attend evening group. Attempted to administer HS medication.  R. The patient did not attend evening group or snacks. Isolated in his room. Refused medication. Made a Do Not Admit Roommate due to inappropriate behavior and boundaries.

## 2013-01-16 NOTE — Progress Notes (Signed)
[facility] MD Progress Note  01/16/2013 1:11 PM Mark TOMPSON  MRN:  [id number] Subjective:  Denies depression, suicidal/homicidal ideations, auditory and visual hallucinations, sleep is poor--he contributes it to not being on cogentin--started BID.  He feels his paranoia increased since not having his cogentin, he had not had it for three days--he decided he would go without it prior to seeing his provider but "ran into trouble very quickly."  Lisinopril started (he did not want a diuretic) due to elevated blood pressures. Diagnosis:   Axis I: Chronic Paranoid Schizophrenia Axis II: Deferred Axis III:  Past Medical History  Diagnosis Date  . Schizophrenia   . 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 Substance abuse in remission   . Psoriasis     facial redness and flaking  . Back pain    Axis IV: other psychosocial or environmental problems, problems related to social environment and problems with primary support group Axis V: 41-50 serious symptoms  ADL's:  Intact  Sleep: Poor  Appetite:  Good  Suicidal Ideation:  Denies Homicidal Ideation:  Denies  Psychiatric Specialty Exam: Review of Systems  Constitutional: Negative.   HENT: Negative.   Eyes: Negative.   Respiratory: Negative.   Cardiovascular: Negative.   Gastrointestinal: Negative.   Genitourinary: Negative.   Musculoskeletal: Negative.   Skin: Negative.   Neurological: Negative.   Endo/Heme/Allergies: Negative.   Psychiatric/Behavioral: Positive for hallucinations. The patient has insomnia.     Blood pressure 146/86, pulse 97, temperature 99.4 F (37.4 C), temperature source Oral, resp. rate 18, height 6\' 1"  (1.854 m), weight 86.183 kg (190 lb).Body mass index is 25.07 kg/(m^2).  General Appearance: Casual  Eye Contact::  Fair  Speech:  Slow  Volume:  Normal  Mood:  Anxious  Affect:  Congruent  Thought Process:  Coherent  Orientation:  Full (Time, Place, and Person)  Thought Content:  WDL   Suicidal Thoughts:  No  Homicidal Thoughts:  No  Memory:  Immediate;   Fair Recent;   Fair Remote;   Fair  Judgement:  Fair  Insight:  Fair  Psychomotor Activity:  Decreased  Concentration:  Fair  Recall:  Fair  Akathisia:  No  Handed:  Right  AIMS (if indicated):     Assets:  Resilience  Sleep:  Number of Hours: 6   Current Medications: Current Facility-Administered Medications  Medication Dose Route Frequency Provider Last Rate Last Dose  . acetaminophen (TYLENOL) tablet 650 mg  650 mg Oral Q6H PRN Verne Spurr, PA-C   650 mg at 01/14/13 2147  . alum & mag hydroxide-simeth (MAALOX/MYLANTA) 200-200-20 MG/5ML suspension 30 mL  30 mL Oral Q4H PRN Verne Spurr, PA-C      . benztropine (COGENTIN) tablet 1 mg  1 mg Oral BID Nanine Means, NP      . calcium carbonate (OS-CAL - dosed in mg of elemental calcium) tablet 500 mg of elemental calcium  1 tablet Oral BID WC Mojeed Akintayo   500 mg of elemental calcium at 01/16/13 0805  . chlorproMAZINE (THORAZINE) tablet 200 mg  200 mg Oral BID PC Mojeed Akintayo   200 mg at 01/16/13 0806  . cholecalciferol (VITAMIN D) tablet 1,000 Units  1,000 Units Oral Daily Mojeed Akintayo      . magnesium hydroxide (MILK OF MAGNESIA) suspension 30 mL  30 mL Oral Daily PRN Verne Spurr, PA-C      . mirtazapine (REMERON) tablet 15 mg  15 mg Oral QHS Lloyd Huger  Mashburn, PA-C   15 mg at 01/14/13 2146    Lab Results: No results found for this or any previous visit (from the past 48 hour(s)).  Physical Findings: AIMS: Facial and Oral Movements Muscles of Facial Expression: None, normal Lips and Perioral Area: None, normal Jaw: None, normal Tongue: None, normal,Extremity Movements Upper (arms, wrists, hands, fingers): None, normal Lower (legs, knees, ankles, toes): None, normal, Trunk Movements Neck, shoulders, hips: None, normal, Overall Severity Severity of abnormal movements (highest score from questions above): None, normal Incapacitation due to  abnormal movements: None, normal Patient's awareness of abnormal movements (rate only patient's report): No Awareness, Dental Status Current problems with teeth and/or dentures?: No Does patient usually wear dentures?: No  CIWA:    COWS:     Treatment Plan Summary: Daily contact with patient to assess and evaluate symptoms and progress in treatment Medication management  Plan:  Review of chart, vital signs, medications, and notes. 1-Individual and group therapy 2-Medication management for depression and anxiety:  Medications reviewed with the patient and cogentin 1 mg BID started per patient request and lisinopril 5 mg for his blood pressure--he did not want a diuretic  3-Coping skills for a chronic mental illness 4-Continue crisis stabilization and management 5-Address health issues--monitoring vital signs, elevated--see above order 6-Treatment plan in progress to prevent relapse of mental illness and anxiety  Medical Decision Making Problem Points:  Established problem, stable/improving (1) and Review of psycho-social stressors (1) Data Points:  Review of new medications or change in dosage (2)  I certify that inpatient services furnished can reasonably be expected to improve the patient's condition.   Nanine Means, PMH-NP 01/16/2013, 1:11 PM

## 2013-01-17 DIAGNOSIS — F200 Paranoid schizophrenia: Secondary | ICD-10-CM

## 2013-01-17 MED ORDER — LISINOPRIL 5 MG PO TABS
5.0000 mg | ORAL_TABLET | Freq: Once | ORAL | Status: AC
  Administered 2013-01-17: 5 mg via ORAL
  Filled 2013-01-17: qty 1

## 2013-01-17 MED ORDER — LISINOPRIL 10 MG PO TABS
10.0000 mg | ORAL_TABLET | Freq: Every day | ORAL | Status: DC
Start: 2013-01-18 — End: 2013-01-18
  Administered 2013-01-18: 10 mg via ORAL
  Filled 2013-01-17 (×2): qty 1

## 2013-01-17 NOTE — Progress Notes (Addendum)
[facility] MD Progress Note  01/17/2013 11:23 AM [name]  MRN:  [id number]  Subjective:  "I came in last Friday. I see Hessie Diener at the outpatient clinic. I had stopped taking my Cogentin, but still taking my Thorazine. Then I went about 2 days without sleep. Things were not going well with me. Then I had to come inpatient for stabilization. I feel better today. Feeling like I'm getting ready to get out of here. My mood is better too".  Diagnosis:   Axis I: Schizophrenia, paranoid type Axis II: Deferred Axis III:  Past Medical History  Diagnosis Date  . Schizophrenia   . 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 Substance abuse in remission   . Psoriasis     facial redness and flaking  . Back pain    Axis IV: other psychosocial or environmental problems Axis V: 41-50 serious symptoms  ADL's:  Impaired  Sleep: Fair  Appetite:  Fair  Suicidal Ideation:  Plan:  Denies Intent:  Denies Means:  Denies Homicidal Ideation:  Plan:  Denies Intent:  Denies Means:  Denies  AEB (as evidenced by): Per patient's reports  Psychiatric Specialty Exam: Review of Systems  Constitutional: Negative.   HENT: Negative.   Eyes: Negative.   Respiratory: Negative.   Cardiovascular: Negative.   Gastrointestinal: Negative.   Genitourinary: Negative.   Musculoskeletal: Negative.   Skin: Negative.   Neurological: Negative.   Endo/Heme/Allergies: Negative.   Psychiatric/Behavioral: Positive for depression (Currently being stabilized with medication.). Negative for suicidal ideas, hallucinations, memory loss and substance abuse. The patient is nervous/anxious (Currently being stabilized with medication.) and has insomnia (Currently being stabilized with medication).     Blood pressure 141/77, pulse 76, temperature 98.3 F (36.8 C), temperature source Oral, resp. rate 18, height 6\' 1"  (1.854 m), weight 86.183 kg (190 lb).Body mass index is 25.07 kg/(m^2).  General Appearance:  Disheveled, obese  Eye Contact::  Good  Speech:  Clear and Coherent and Normal Rate  Volume:  Normal  Mood:  Fair, I'm starting to feel better.  Affect:  Restricted  Thought Process:  Coherent and Goal Directed  Orientation:  Full (Time, Place, and Person)  Thought Content:  Rumination  Suicidal Thoughts:  No  Homicidal Thoughts:  No  Memory:  Immediate;   Good Recent;   Good Remote;   Good  Judgement:  Fair  Insight:  Fair  Psychomotor Activity:  Normal  Concentration:  Fair  Recall:  Good  Akathisia:  No  Handed:  Right  AIMS (if indicated):     Assets:  Desire for Improvement  Sleep:  Number of Hours: 5.25   Current Medications: Current Facility-Administered Medications  Medication Dose Route Frequency Provider Last Rate Last Dose  . acetaminophen (TYLENOL) tablet 650 mg  650 mg Oral Q6H PRN Verne Spurr, PA-C   650 mg at 01/14/13 2147  . alum & mag hydroxide-simeth (MAALOX/MYLANTA) 200-200-20 MG/5ML suspension 30 mL  30 mL Oral Q4H PRN Verne Spurr, PA-C      . benztropine (COGENTIN) tablet 1 mg  1 mg Oral BID Nanine Means, NP   1 mg at 01/17/13 0802  . calcium carbonate (OS-CAL - dosed in mg of elemental calcium) tablet 500 mg of elemental calcium  1 tablet Oral BID WC Mojeed Akintayo   500 mg of elemental calcium at 01/17/13 0802  . chlorproMAZINE (THORAZINE) tablet 200 mg  200 mg Oral BID PC Mojeed Akintayo   200 mg  at 01/17/13 0805  . cholecalciferol (VITAMIN D) tablet 1,000 Units  1,000 Units Oral Daily Mojeed Akintayo      . [START ON 01/18/2013] lisinopril (PRINIVIL,ZESTRIL) tablet 10 mg  10 mg Oral Daily Sanjuana Kava, NP      . lisinopril (PRINIVIL,ZESTRIL) tablet 5 mg  5 mg Oral Once Sanjuana Kava, NP      . magnesium hydroxide (MILK OF MAGNESIA) suspension 30 mL  30 mL Oral Daily PRN Verne Spurr, PA-C      . mirtazapine (REMERON) tablet 15 mg  15 mg Oral QHS Verne Spurr, PA-C   15 mg at 01/16/13 2115    Lab Results: No results found for this or any previous  visit (from the past 48 hour(s)).  Physical Findings: AIMS: Facial and Oral Movements Muscles of Facial Expression: None, normal Lips and Perioral Area: None, normal Jaw: None, normal Tongue: None, normal,Extremity Movements Upper (arms, wrists, hands, fingers): None, normal Lower (legs, knees, ankles, toes): None, normal, Trunk Movements Neck, shoulders, hips: None, normal, Overall Severity Severity of abnormal movements (highest score from questions above): None, normal Incapacitation due to abnormal movements: None, normal Patient's awareness of abnormal movements (rate only patient's report): No Awareness, Dental Status Current problems with teeth and/or dentures?: No Does patient usually wear dentures?: No  CIWA:    COWS:     Treatment Plan Summary: Daily contact with patient to assess and evaluate symptoms and progress in treatment Medication management  Plan: Supportive approach/coping skills/stabilization. Increased Lisinopril from 5 mg to 10 mg daily. Encouraged out of room, participation in group sessions and application of coping skills when distressed. Will continue to monitor response to/adverse effects of medications in use to assure effectiveness. Continue to monitor mood, behavior and interaction with staff and other patients. Continue current plan of care.  Medical Decision Making Problem Points:  Established problem, stable/improving (1), Review of last therapy session (1) and Review of psycho-social stressors (1) Data Points:  Review of medication regiment & side effects (2) Review of new medications or change in dosage (2)  I certify that inpatient services furnished can reasonably be expected to improve the patient's condition.   Armandina Stammer I 01/17/2013, 11:23 AM

## 2013-01-17 NOTE — Progress Notes (Signed)
Adult Psychoeducational Group Note  Date:  01/17/2013 Time:  9:07 PM  Group Topic/Focus:  Wrap-Up Group:   The focus of this group is to help patients review their daily goal of treatment and discuss progress on daily workbooks.  Participation Level:  Minimal  Participation Quality:  Appropriate  Affect:  Flat  Cognitive:  Appropriate  Insight: Appropriate  Engagement in Group:  Engaged  Modes of Intervention:  Support  Additional Comments:  Patient attended and participated in group tonight. He reports that he spoke with his family today. He attended his groups and meals. He advised that his support system was a handful of people that he talk to about 2-3 times a week  Scot Dock 01/17/2013, 9:07 PM

## 2013-01-17 NOTE — Progress Notes (Signed)
D.  Pt pleasant on approach, denies complaints at this time.  Positive for evening group with appropriate participation, see group notes.  Denies SI/HI/hallucinations at this time.  Interacting appropriately within milieu, feels ready for discharge.  A.  Support and encouragement offered  R.  Pt remains safe on unit, will continue to monitor.

## 2013-01-17 NOTE — Progress Notes (Signed)
D) Pt talking a bit more today and even smiling a couple of times in conversation. Joked about his height. Stated "even if I shrink a bit, I would only shrink to 5" 11" because right now I am 6\' 2" . " Stated that he spoke with his mother last night for mothers day. Affect remains flat and mood is paranoid, but less so than yesterday. A) Given support and reassurance along with praise. Encouraged Pt to stay out of his room and to attend the groups or just watch a show on TV. R) Remains paranoid but less so today than yesterday.

## 2013-01-18 ENCOUNTER — Encounter (HOSPITAL_COMMUNITY): Payer: Self-pay | Admitting: Psychiatry

## 2013-01-18 MED ORDER — LISINOPRIL 10 MG PO TABS: ORAL_TABLET | ORAL | Status: AC

## 2013-01-18 MED ORDER — BENZTROPINE MESYLATE 1 MG PO TABS: ORAL_TABLET | ORAL | Status: DC

## 2013-01-18 MED ORDER — CHLORPROMAZINE HCL 200 MG PO TABS: ORAL_TABLET | ORAL | Status: DC

## 2013-01-18 MED ORDER — VITAMIN D 1000 UNITS PO TABS: 1000.0000 [IU] | ORAL_TABLET | Freq: Every day | ORAL | Status: DC

## 2013-01-18 MED ORDER — MIRTAZAPINE 15 MG PO TABS: ORAL_TABLET | ORAL | Status: DC

## 2013-01-18 MED ORDER — LISINOPRIL 10 MG PO TABS: ORAL_TABLET | ORAL | Status: DC

## 2013-01-18 MED ORDER — CALCIUM CARBONATE 600 MG PO TABS: 600.0000 mg | ORAL_TABLET | Freq: Two times a day (BID) | ORAL | Status: DC

## 2013-01-18 NOTE — Progress Notes (Signed)
Recreation Therapy Notes  Date: 05.12.2014  Time: 9:30am  Location: [address] Day Room   Group Topic/Focus: Decision Making   Participation Level:  Active   Participation Quality:  Appropriate   Affect:  Euthymic   Cognitive:  Oriented   Additional Comments: Activity: If ; Explanation: Patients were asked an "If" question, for example If you could travel anywhere in the world where would that be?"   Patient participated in group activity. Patient was asked "If you could have any kind of pet, what would you have?" Patient stated he would like a [place] because they are well trained dogs.   Marykay Lex Jourden Gilson, LRT/CTRS  Jearl Klinefelter 01/18/2013 12:55 PM

## 2013-01-18 NOTE — Progress Notes (Signed)
Patient ID: [name], male   DOB: 1956/01/19, 57 y.o.   MRN: [id number] Patient discharged per physician order; patient denies SI/HI and A/V hallucinations; patient received copy of AVS, samples, and bus pass after it was reviewed with him; patient prescriptions were electronically sent to designated pharmacy and patient confirmed that was the correct pharmacy and also recieved confirmation of receipt of prescriptions at designated pharmacy; patient verbalized and signed that he received all his belongings; patient had no other questions or concerns at this time; patient left the unit ambulatory

## 2013-01-18 NOTE — Progress Notes (Signed)
Date: 01/18/2013  Time: 11:50 AM  Group Topic/Focus:  Self Care: The focus of this group is to help patients understand the importance of self-care in order to improve or restore emotional, physical, spiritual, interpersonal, and financial health.  Participation Level: Active  Participation Quality: Appropriate, Sharing and Supportive  Affect: Appropriate  Cognitive: Appropriate  Insight: Appropriate  Engagement in Group: Supportive  Modes of Intervention: Discussion, Education and Support  Additional Comments: Pt was appropriate.  [name] M  01/18/2013, 11:50 AM

## 2013-01-18 NOTE — Discharge Summary (Signed)
Physician Discharge Summary Note  Patient:  [name] is an 57 y.o., male MRN:  [id number] DOB:  02/01/1956 Patient phone:  [phone] (home)  Patient address:   [address] [place] [address],   Date of Admission:  01/14/2013 Date of Discharge: 01/18/2013  Reason for Admission:  Paranoid thoughts of seeing people running .  Discharge Diagnoses: Principal Problem:   Schizophrenia, paranoid type  Review of Systems  Constitutional: Negative.   HENT: Negative.   Eyes: Negative.   Respiratory: Negative.   Cardiovascular: Negative.   Gastrointestinal: Negative.   Genitourinary: Negative.   Musculoskeletal: Negative.   Skin: Positive for rash (He suffers from Psoraisis).  Neurological: Negative.   Endo/Heme/Allergies: Negative.   Psychiatric/Behavioral: Positive for substance abuse (completed detoxificatuion from alcohol). Negative for depression, suicidal ideas and hallucinations. The patient has insomnia (Stabilized on medication). The patient is not nervous/anxious.    Axis Diagnosis:   AXIS I:  Schizophrenia Paranoid AXIS II:  Deferred AXIS III:   Past Medical History  Diagnosis Date  . Schizophrenia   . 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 Substance abuse in remission   . Psoriasis     facial redness and flaking  . Back pain    AXIS IV:  occupational problems, other psychosocial or environmental problems and problems related to social environment AXIS V:  61-70 mild symptoms  Level of Care:  OP  Hospital Course:  Patient is a 57 year old man, divorced, unemployed attorney with a long history of Schizophrenia. Patient reports that he got out of the routines at home and missing medications for several days. He reports 4 days of being anxious, nervous, feeling paranoid, irritable, disorganized, disoriented, seeing shadows and difficulty sleeping. He also reports seeing people running, and feeling the impending threat of being  assaulted and has to stayed up all night to prevent that. Last time he felt this way was 2 years ago when he was hospitalized at [facility]. Reports occasional alcohol and cocaine use. Last cocaine use was 6 months ago. Last alcohol 1 beer on 01/12/2013. Currently, patient denies suicidal/homicidal ideation, intent and plan.   [name] was treated and stabilized on Thorazine and cogentin for mood symptoms.  He also received medication management for other medical issues. He was enrolled/attended group therapy sessions with the other patients. His blood pressure pressure remained stable.  At the time of discharge, he denies A/V/HSI/HI and/or Delusional thoughts. He received from [facility] a 4 days worth supply samples of his discharge medications. He left [facility] in no apparent distress.   Consults:  NA  Significant Diagnostic Studies:  labs: CBC, CMP, TOXICOLOGY, UDS, AND UA  Discharge Vitals:   Blood pressure 96/65, pulse 100, temperature 98.3 F (36.8 C), temperature source Oral, resp. rate 16, height 6\' 1"  (1.854 m), weight 86.183 kg (190 lb). Body mass index is 25.07 kg/(m^2). Lab Results:   No results found for this or any previous visit (from the past 72 hour(s)).  Physical Findings: AIMS: Facial and Oral Movements Muscles of Facial Expression: None, normal Lips and Perioral Area: None, normal Jaw: None, normal Tongue: None, normal,Extremity Movements Upper (arms, wrists, hands, fingers): None, normal Lower (legs, knees, ankles, toes): None, normal, Trunk Movements Neck, shoulders, hips: None, normal, Overall Severity Severity of abnormal movements (highest score from questions above): None, normal Incapacitation due to abnormal movements: None, normal Patient's awareness of abnormal movements (rate only patient's  report): No Awareness, Dental Status Current problems with teeth and/or dentures?: No Does patient usually wear dentures?: No  CIWA:    COWS:     Psychiatric  Specialty Exam: See Psychiatric Specialty Exam and Suicide Risk Assessment completed by Attending Physician prior to discharge.  Discharge destination:  Home  Is patient on multiple antipsychotic therapies at discharge:  No   Has Patient had three or more failed trials of antipsychotic monotherapy by history:  No  Recommended Plan for Multiple Antipsychotic Therapies: Na     Medication List    TAKE these medications     Indication   benztropine 1 MG tablet  Commonly known as:  COGENTIN  Take 1 tablet twice daily: For drug induced involuntary muscle movement   Indication:  Extrapyramidal Reaction caused by Medications     calcium carbonate 600 MG Tabs  Commonly known as:  OS-CAL  Take 1 tablet (600 mg total) by mouth 2 (two) times daily with a meal. For bone health   Indication:  Low Amount of Calcium in the Blood, Low Calcium     chlorproMAZINE 200 MG tablet  Commonly known as:  THORAZINE  Take 1 tablet twice daily: For mood control   Indication:  Psychosis, Schizophrenia     cholecalciferol 1000 UNITS tablet  Commonly known as:  VITAMIN D  Take 1 tablet (1,000 Units total) by mouth daily. For bone health   Indication:  Low Calcium     lisinopril 10 MG tablet  Commonly known as:  PRINIVIL,ZESTRIL  Take 1 tablet daily: For blood pressure control   Indication:  High Blood Pressure     mirtazapine 15 MG tablet  Commonly known as:  REMERON  Take 1 tablet at bedtime:For sleep/depression   Indication:  Trouble Sleeping, Major Depressive Disorder       Follow-up Information   Follow up with [facility] On 03/03/2013. (1:30 PM with Jorje Guild)    Contact information:   [address]  [place]  [phone]     Follow-up recommendations:  Other:  Activities as tolerated, diet as instructed by primary Physician, Follow up appointments as recommended.  Comments:  Take your medicated as instructed.  Do not use alcohol to self treat  depression.   Go to the nearest hospital ER for any kind of emergency.  Keep all appointment with your other Physicians for your other medical issues.  Total Discharge Time:  Greater than 30 minutes.  SignedDahlia Byes, C PMHNP-BC 01/18/2013, 10:22 AM

## 2013-01-18 NOTE — Progress Notes (Signed)
[facility] Adult Case Management Discharge Plan :  Will you be returning to the same living situation after discharge: Yes,  home At discharge, do you have transportation home?:Yes,  bus pass Do you have the ability to pay for your medications:Yes,  insurance  Release of information consent forms completed and in the chart;  Patient's signature needed at discharge.  Patient to Follow up at: Follow-up Information   Follow up with [facility] On 03/03/2013. (1:30 PM with Jorje Guild)    Contact information:   [address]  [place]  [phone]      Patient denies SI/HI:   Yes,  yes    Safety Planning and Suicide Prevention discussed:  Yes,  yes  [name] 01/18/2013, 9:57 AM

## 2013-01-18 NOTE — Tx Team (Signed)
  Interdisciplinary Treatment Plan Update   Date Reviewed:  01/18/2013  Time Reviewed:  9:58 AM  Progress in Treatment:   Attending groups: Yes Participating in groups: Yes Taking medication as prescribed: Yes  Tolerating medication: Yes Family/Significant other contact made: No Patient understands diagnosis: Yes  Discussing patient identified problems/goals with staff: Yes Medical problems stabilized or resolved: Yes Denies suicidal/homicidal ideation: Yes  In tx team Patient has not harmed self or others: Yes  For review of initial/current patient goals, please see plan of care.  Estimated Length of Stay:  D/C today  Reason for Continuation of Hospitalization:   New Problems/Goals identified:  N/A  Discharge Plan or Barriers:   return home, follow up outpt  Additional Comments:  Attendees:  Signature: Thedore Mins, MD 01/18/2013 9:58 AM   Signature: Richelle Ito, LCSW 01/18/2013 9:58 AM  Signature:  01/18/2013 9:58 AM  Signature: Leighton Parody, RN 01/18/2013 9:58 AM  Signature:  01/18/2013 9:58 AM  Signature:  01/18/2013 9:58 AM  Signature:   01/18/2013 9:58 AM  Signature:    Signature:    Signature:    Signature:    Signature:    Signature:      Scribe for Treatment Team:   Richelle Ito, LCSW  01/18/2013 9:58 AM

## 2013-01-18 NOTE — [facility] Suicide Risk Assessment (Signed)
Suicide Risk Assessment  Discharge Assessment     Demographic Factors:  Male, Caucasian, Living alone and Unemployed  Mental Status Per Nursing Assessment::   On Admission:  NA  Current Mental Status by Physician: patient denies suicidal ideation,intent or plan.  Loss Factors: Decrease in vocational status, Decline in physical health and Financial problems/change in socioeconomic status  Historical Factors: Family history of mental illness or substance abuse and Impulsivity  Risk Reduction Factors:   Positive social support, Positive therapeutic relationship and Positive coping skills or problem solving skills  Continued Clinical Symptoms:  Alcohol/Substance Abuse/Dependencies More than one psychiatric diagnosis Previous Psychiatric Diagnoses and Treatments  Cognitive Features That Contribute To Risk:  Closed-mindedness Polarized thinking    Suicide Risk:  Minimal: No identifiable suicidal ideation.  Patients presenting with no risk factors but with morbid ruminations; may be classified as minimal risk based on the severity of the depressive symptoms  Discharge Diagnoses:   AXIS I:  Schizophrenia, paranoid type  AXIS II:  Deferred AXIS III:   Past Medical History  Diagnosis Date  . Schizophrenia   . 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 Substance abuse in remission   . Psoriasis     facial redness and flaking  . Back pain    AXIS IV:  economic problems, other psychosocial or environmental problems and problems related to social environment AXIS V:  61-70 mild symptoms  Plan Of Care/Follow-up recommendations:  Activity:  as tolerated Diet:  healthy Tests:  routine Other:  patient to keep his after care appointment   Is patient on multiple antipsychotic therapies at discharge:  No   Has Patient had three or more failed trials of antipsychotic monotherapy by history:  No  Recommended Plan for Multiple Antipsychotic  Therapies: N/A  Ismaeel Arvelo,MD 01/18/2013, 9:19 AM

## 2013-01-19 NOTE — [facility] Suicide Risk Assessment (Signed)
[facility] INPATIENT:  Family/Significant Other Suicide Prevention Education  Suicide Prevention Education:  Education Completed; No one has been identified by the patient as the family member/significant other with whom the patient will be residing, and identified as the person(s) who will aid the patient in the event of a mental health crisis (suicidal ideations/suicide attempt).  With written consent from the patient, the family member/significant other has been provided the following suicide prevention education, prior to the and/or following the discharge of the patient.  The suicide prevention education provided includes the following:  Suicide risk factors  Suicide prevention and interventions  National Suicide Hotline telephone number  [facility] assessment telephone number  [facility] Emergency Assistance 911  [facility] and/or Residential Mobile Crisis Unit telephone number  Request made of family/significant other to:  Remove weapons (e.g., guns, rifles, knives), all items previously/currently identified as safety concern.    Remove drugs/medications (over-the-counter, prescriptions, illicit drugs), all items previously/currently identified as a safety concern.  The family member/significant other verbalizes understanding of the suicide prevention education information provided.  The family member/significant other agrees to remove the items of safety concern listed above.  [name] did not c/o SI at admission, nor did he endorse SI during his stay with [place].  No SPE required  Ida Rogue 01/19/2013, 5:03 PM

## 2013-01-20 NOTE — Progress Notes (Signed)
Patient Discharge Instructions:  Next Level Care Provider Has Access to the EMR, 01/20/13 Records provided to [facility] via [facility]/Epic Access.  Jerelene Redden, 01/20/2013, 11:55 AM

## 2013-01-22 NOTE — Discharge Summary (Signed)
  Read and agreed.  Thedore Mins, MD 01/22/2013 11:25 AM

## 2013-02-11 ENCOUNTER — Telehealth: Payer: Self-pay

## 2013-02-11 NOTE — Telephone Encounter (Signed)
Pt request access code for my chart; after getting 3 pt identifiers, name, DOB and last 4 digits of social gave pt access code: [email or web address]. Pt voiced understanding.

## 2013-02-14 ENCOUNTER — Other Ambulatory Visit (HOSPITAL_COMMUNITY): Payer: Self-pay | Admitting: Physician Assistant

## 2013-02-18 ENCOUNTER — Other Ambulatory Visit (HOSPITAL_COMMUNITY): Payer: Self-pay | Admitting: Physician Assistant

## 2013-02-18 DIAGNOSIS — F200 Paranoid schizophrenia: Secondary | ICD-10-CM

## 2013-03-03 ENCOUNTER — Ambulatory Visit (HOSPITAL_COMMUNITY): Payer: Self-pay | Admitting: Physician Assistant

## 2013-03-06 ENCOUNTER — Encounter (HOSPITAL_COMMUNITY): Payer: Self-pay | Admitting: Emergency Medicine

## 2013-03-06 ENCOUNTER — Emergency Department (HOSPITAL_COMMUNITY): Payer: No Typology Code available for payment source

## 2013-03-06 ENCOUNTER — Emergency Department (HOSPITAL_COMMUNITY)
Admission: EM | Admit: 2013-03-06 | Discharge: 2013-03-06 | Disposition: A | Discharge status: Home / Self Care | Payer: No Typology Code available for payment source | Attending: Emergency Medicine | Admitting: Emergency Medicine

## 2013-03-06 DIAGNOSIS — IMO0002 Reserved for concepts with insufficient information to code with codable children: Secondary | ICD-10-CM | POA: Insufficient documentation

## 2013-03-06 DIAGNOSIS — Y9389 Activity, other specified: Secondary | ICD-10-CM | POA: Insufficient documentation

## 2013-03-06 DIAGNOSIS — F209 Schizophrenia, unspecified: Secondary | ICD-10-CM | POA: Insufficient documentation

## 2013-03-06 DIAGNOSIS — L408 Other psoriasis: Secondary | ICD-10-CM | POA: Insufficient documentation

## 2013-03-06 DIAGNOSIS — Z79899 Other long term (current) drug therapy: Secondary | ICD-10-CM | POA: Insufficient documentation

## 2013-03-06 DIAGNOSIS — R51 Headache: Secondary | ICD-10-CM | POA: Insufficient documentation

## 2013-03-06 DIAGNOSIS — M542 Cervicalgia: Secondary | ICD-10-CM | POA: Insufficient documentation

## 2013-03-06 DIAGNOSIS — F1911 Other psychoactive substance abuse, in remission: Secondary | ICD-10-CM | POA: Insufficient documentation

## 2013-03-06 DIAGNOSIS — F17200 Nicotine dependence, unspecified, uncomplicated: Secondary | ICD-10-CM | POA: Insufficient documentation

## 2013-03-06 DIAGNOSIS — T148XXA Other injury of unspecified body region, initial encounter: Secondary | ICD-10-CM

## 2013-03-06 DIAGNOSIS — Y92410 Unspecified street and highway as the place of occurrence of the external cause: Secondary | ICD-10-CM | POA: Insufficient documentation

## 2013-03-06 DIAGNOSIS — I10 Essential (primary) hypertension: Secondary | ICD-10-CM | POA: Insufficient documentation

## 2013-03-06 DIAGNOSIS — Z8547 Personal history of malignant neoplasm of testis: Secondary | ICD-10-CM | POA: Insufficient documentation

## 2013-03-06 DIAGNOSIS — Z8739 Personal history of other diseases of the musculoskeletal system and connective tissue: Secondary | ICD-10-CM | POA: Insufficient documentation

## 2013-03-06 HISTORY — DX: Essential (primary) hypertension: I10
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
## 2013-03-06 NOTE — ED Notes (Signed)
[id number]<AV> Expected date:<BR> Expected time:<BR> Means of arrival:<BR> Comments:<BR> EMS, 61M, Fall

## 2013-03-06 NOTE — ED Notes (Signed)
Went to room to discharge patient. Patient is not in room. Searched department and restrooms and patient not located. EDP aware. Discharge instructions did not include any medication prescriptions.
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
## 2013-03-07 ENCOUNTER — Telehealth: Payer: Self-pay | Admitting: Family Medicine

## 2013-03-07 DIAGNOSIS — R918 Other nonspecific abnormal finding of lung field: Secondary | ICD-10-CM

## 2013-03-07 NOTE — Telephone Encounter (Signed)
Call pt.  Possible spiculated mass in the left upper lobe of the lung which needs followup, noted on imaging at the ER.  Needs CT chest.  Order is in.  Please call pt about scheduling.

## 2013-03-09 NOTE — Telephone Encounter (Signed)
Pt called back and said he has had chest xray and scan of chest at [facility] 2 days ago and can have doctor look at those xrays. Pt is not having another chest xray or scan. Pt hung up. I called pt back; pt hung up. I called pt back to explain and pt said he has said what he needs to say and hung up again. Christell Constant and she asked me to forward to Dr Para March.

## 2013-03-10 NOTE — Telephone Encounter (Signed)
I called pt. No answer.  LMOVM that we would send him a letter. Please send letter certified for delivery verification. It should be in the EMR. Thanks.

## 2013-03-10 NOTE — Telephone Encounter (Signed)
Letter signed by Dr. Para March and given to [facility] [employer] stage manager) to be forwarded to appropriate person to have it mailed.

## 2013-03-15 ENCOUNTER — Telehealth: Payer: Self-pay | Admitting: Family Medicine

## 2013-03-15 NOTE — Telephone Encounter (Signed)
noted, thanks.  I will defer to him about arranging follow.  We have tried to call 5 times and also sent a letter.

## 2013-03-15 NOTE — Telephone Encounter (Signed)
Called the patient and finally got thru to speak to him directly, he said he declines to have the CT Scan and will not have one done. I was not aware of the letter that was sent last week to the patient. He hung up on me after telling me that he will not have the CT Scan done.

## 2013-03-16 ENCOUNTER — Emergency Department (HOSPITAL_COMMUNITY)
Admission: EM | Admit: 2013-03-16 | Discharge: 2013-03-17 | Disposition: A | Discharge status: Home / Self Care | Payer: Medicare HMO | Attending: Emergency Medicine | Admitting: Emergency Medicine

## 2013-03-16 ENCOUNTER — Encounter (HOSPITAL_COMMUNITY): Payer: Self-pay | Admitting: *Deleted

## 2013-03-16 DIAGNOSIS — F17200 Nicotine dependence, unspecified, uncomplicated: Secondary | ICD-10-CM | POA: Insufficient documentation

## 2013-03-16 DIAGNOSIS — I10 Essential (primary) hypertension: Secondary | ICD-10-CM | POA: Insufficient documentation

## 2013-03-16 DIAGNOSIS — F1911 Other psychoactive substance abuse, in remission: Secondary | ICD-10-CM | POA: Insufficient documentation

## 2013-03-16 DIAGNOSIS — F200 Paranoid schizophrenia: Secondary | ICD-10-CM | POA: Insufficient documentation

## 2013-03-16 DIAGNOSIS — Z8739 Personal history of other diseases of the musculoskeletal system and connective tissue: Secondary | ICD-10-CM | POA: Insufficient documentation

## 2013-03-16 DIAGNOSIS — Z79899 Other long term (current) drug therapy: Secondary | ICD-10-CM | POA: Insufficient documentation

## 2013-03-16 DIAGNOSIS — Z87448 Personal history of other diseases of urinary system: Secondary | ICD-10-CM | POA: Insufficient documentation

## 2013-03-16 DIAGNOSIS — L408 Other psoriasis: Secondary | ICD-10-CM | POA: Insufficient documentation

## 2013-03-16 LAB — COMPREHENSIVE METABOLIC PANEL
BUN: 10 mg/dL (ref 6–23)
CO2: 26 mEq/L (ref 19–32)
Calcium: 9.6 mg/dL (ref 8.4–10.5)
Chloride: 83 mEq/L — ABNORMAL LOW (ref 96–112)
Creatinine, Ser: 0.57 mg/dL (ref 0.50–1.35)
GFR calc Af Amer: >90 mL/min (ref >90)
GFR calc non Af Amer: >90 mL/min (ref >90)
Glucose, Bld: 121 mg/dL — ABNORMAL HIGH (ref 70–99)
Total Bilirubin: 0.7 mg/dL (ref 0.3–1.2)

## 2013-03-16 LAB — CBC WITH DIFFERENTIAL/PLATELET
Eosinophils Relative: 1 % (ref 0–5)
HCT: 35.3 % — ABNORMAL LOW (ref 39.0–52.0)
Lymphs Abs: 1.6 10*3/uL (ref 0.7–4.0)
MCH: 32.3 pg (ref 26.0–34.0)
MCV: 85.7 fL (ref 78.0–100.0)
Monocytes Absolute: 1 10*3/uL (ref 0.1–1.0)
Monocytes Relative: 12 % (ref 3–12)
Neutro Abs: 5.3 10*3/uL (ref 1.7–7.7)
Platelets: 223 10*3/uL (ref 150–400)
RBC: 4.12 MIL/uL — ABNORMAL LOW (ref 4.22–5.81)
WBC: 8 10*3/uL (ref 4.0–10.5)

## 2013-03-16 LAB — POCT I-STAT, CHEM 8
BUN: 9 mg/dL (ref 6–23)
Calcium, Ion: 1.15 mmol/L (ref 1.12–1.23)
Chloride: 85 mEq/L — ABNORMAL LOW (ref 96–112)
Creatinine, Ser: 0.8 mg/dL (ref 0.50–1.35)
Glucose, Bld: 109 mg/dL — ABNORMAL HIGH (ref 70–99)
HCT: 40 % (ref 39.0–52.0)
Potassium: 4 mEq/L (ref 3.5–5.1)

## 2013-03-16 LAB — RAPID URINE DRUG SCREEN, HOSP PERFORMED
Barbiturates: NOT DETECTED
Benzodiazepines: NOT DETECTED
Cocaine: NOT DETECTED
Opiates: NOT DETECTED

## 2013-03-16 MED ORDER — ZOLPIDEM TARTRATE 5 MG PO TABS: 5.0000 mg | ORAL_TABLET | Freq: Every evening | ORAL | Status: DC | PRN

## 2013-03-16 MED ORDER — LORAZEPAM 1 MG PO TABS: 1.0000 mg | ORAL_TABLET | Freq: Three times a day (TID) | ORAL | Status: DC | PRN

## 2013-03-16 MED ORDER — ALUM & MAG HYDROXIDE-SIMETH 200-200-20 MG/5ML PO SUSP: 30.0000 mL | ORAL | Status: DC | PRN

## 2013-03-16 MED ORDER — SODIUM CHLORIDE 1 G PO TABS
1.0000 g | ORAL_TABLET | Freq: Once | ORAL | Status: AC
  Administered 2013-03-16: 1 g via ORAL
  Filled 2013-03-16: qty 1

## 2013-03-16 MED ORDER — IBUPROFEN 200 MG PO TABS: 600.0000 mg | ORAL_TABLET | Freq: Three times a day (TID) | ORAL | Status: DC | PRN

## 2013-03-16 MED ORDER — ACETAMINOPHEN 325 MG PO TABS: 650.0000 mg | ORAL_TABLET | ORAL | Status: DC | PRN

## 2013-03-16 MED ORDER — ONDANSETRON HCL 4 MG PO TABS: 4.0000 mg | ORAL_TABLET | Freq: Three times a day (TID) | ORAL | Status: DC | PRN

## 2013-03-16 NOTE — ED Notes (Addendum)
Pt brought in by [employer]. [employer] reports that pt was hallucinating, stating that he saw people with guns. Also reports that pt has ran out of medication (thorazine) 3 days ago and has not been able to refill it.

## 2013-03-16 NOTE — ED Notes (Signed)
Pt has been changed into blue scrubs.  Pt has one bag of belongings.

## 2013-03-16 NOTE — ED Provider Notes (Signed)
History  This chart was scribed for non-physician practitioner working with Geoffery Lyons, MD by Greggory Stallion, ED scribe. This patient was seen in room [facility] and the patient's care was started at 9:48 PM.  CSN: [id number] Arrival date & time 03/16/13  2036   Chief Complaint  Patient presents with  . Medical Clearance   The history is provided by the patient. No language interpreter was used.    HPI Comments: [name] is a 57 y.o. Male with h/o schizophrenia brought to ED by [employer] who presents to the Emergency Department complaining of hallucinations. Pt states he was seeing a person with a gun. Denies SI/HI. States that he has run out of his thorazine 3 days ago and was not able to refill it as he  missed his appointment on 03/03/2013 to get his medication refilled. Pt denies SI, HI, CP, SOB, abdominal pain, fever, and chills as associated symptoms.   Past Medical History  Diagnosis Date  . Schizophrenia   . 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 Substance abuse in remission   . Psoriasis     facial redness and flaking  . Back pain   . Hypertension    Past Surgical History  Procedure Laterality Date  . Lumbar disc surgery    . Anterior cervical decomp/discectomy fusion  04/27/2012    Procedure: ANTERIOR CERVICAL DECOMPRESSION/DISCECTOMY FUSION 1 LEVEL;  Surgeon: Temple Pacini, MD;  Location: [facility];  Service: Neurosurgery;  Laterality: N/A;  Cervical Five-Six Anterior Cervical Decompression and Fusion   Family History  Problem Relation Age of Onset  . Bipolar disorder Brother   . Drug abuse Brother   . Drug abuse Brother   . Bipolar disorder Brother   . Colon cancer Neg Hx   . Prostate cancer Maternal Uncle    History  Substance Use Topics  . Smoking status: Current Every Day Smoker -- 1.00 packs/day for 12 years    Types: Cigarettes  . Smokeless tobacco: Former Neurosurgeon  . Alcohol Use: Yes     Comment: rarely    Review of Systems   Constitutional: Negative for fever and diaphoresis.  HENT: Negative for neck pain and neck stiffness.   Eyes: Negative for visual disturbance.  Respiratory: Negative for apnea, chest tightness and shortness of breath.   Cardiovascular: Negative for chest pain and palpitations.  Gastrointestinal: Negative for nausea, vomiting, diarrhea and constipation.  Genitourinary: Negative for dysuria.  Musculoskeletal: Negative for gait problem.  Skin: Negative for rash.  Neurological: Negative for dizziness, weakness, light-headedness, numbness and headaches.  Psychiatric/Behavioral: Positive for hallucinations. Negative for suicidal ideas.    Allergies  Review of patient's allergies indicates no known allergies.  Home Medications   Current Outpatient Rx  Name  Route  Sig  Dispense  Refill  . benztropine (COGENTIN) 1 MG tablet   Oral   Take 1 mg by mouth daily.          . calcium carbonate (OS-CAL) 600 MG TABS   Oral   Take 600 mg by mouth daily with breakfast. For bone health         . chlorproMAZINE (THORAZINE) 100 MG tablet      TAKE 2 TABLETS (200 MG) TWICE DAILY: FOR MOOD CONTROL   120 tablet   0   . lisinopril (PRINIVIL,ZESTRIL) 10 MG tablet      Take 1 tablet daily: For blood pressure control   30 tablet  0   . mirtazapine (REMERON) 15 MG tablet      Take 1 tablet at bedtime:For sleep/depression   30 tablet   0    BP 154/94  Pulse 90  Temp(Src) 98 F (36.7 C) (Oral)  Resp 18  SpO2 98%  Physical Exam  Nursing note and vitals reviewed. Constitutional: He is oriented to person, place, and time. He appears well-developed and well-nourished. No distress.  HENT:  Head: Normocephalic and atraumatic.  Eyes: Conjunctivae and EOM are normal.  Neck: Normal range of motion. Neck supple.  No meningeal signs  Cardiovascular: Normal rate, regular rhythm and normal heart sounds.  Exam reveals no gallop and no friction rub.   No murmur heard. Pulmonary/Chest: Effort  normal and breath sounds normal. No respiratory distress. He has no wheezes. He has no rales. He exhibits no tenderness.  Abdominal: Soft. Bowel sounds are normal. He exhibits no distension. There is no tenderness. There is no rebound and no guarding.  Musculoskeletal: Normal range of motion. He exhibits no edema and no tenderness.  Neurological: He is alert and oriented to person, place, and time. No cranial nerve deficit.  Skin: Skin is warm and dry. He is not diaphoretic. No erythema.  Psychiatric:  No SI/HI. Positive visual hallucinations. Negative audio hallucinations. Tangential, flat affect, flight of ideas.    ED Course  Procedures (including critical care time)  DIAGNOSTIC STUDIES: Oxygen Saturation is 98% on RA, normal by my interpretation.    COORDINATION OF CARE: 9:59 PM-Discussed treatment plan which includes talking to a few other doctors with pt at bedside and pt agreed to plan. Advised pt to eat something because his sugars are low.   Labs Reviewed  CBC WITH DIFFERENTIAL - Abnormal; Notable for the following:    RBC 4.12 (*)    HCT 35.3 (*)    MCHC 37.7 (*)    All other components within normal limits  COMPREHENSIVE METABOLIC PANEL - Abnormal; Notable for the following:    Sodium 120 (*)    Chloride 83 (*)    Glucose, Bld 121 (*)    All other components within normal limits  POCT I-STAT, CHEM 8 - Abnormal; Notable for the following:    Sodium 122 (*)    Chloride 85 (*)    Glucose, Bld 109 (*)    All other components within normal limits  URINE RAPID DRUG SCREEN (HOSP PERFORMED)  ETHANOL   No results found. No diagnosis found.  MDM  Pt presents to the ED for medical clearance.  Pt is not currently having SI or HI ideations. Pt is cooperative, though tangential with flight of ideas, difficult to keep on topic. The patient currently does not have any acute physical complaints and is in no acute distress. The patient was brought to ED by [employer] after pt called 911  saying that he saw people in his house with guns. [employer] ascertained that there were never any such people.   Labs ordered. Pt was mildly hyponatremic which has happened in the past. Fed pt and gave salt tablet. Will re-assess. Psych hold orders placed, ACT consult ordered, pt was moved to Psych ED for further evaluation.  With only small increase in Na after interventions, pt is not yet medically cleared. Pt will stay on the medical side until sodium level improves. Signed out to [employer], PA-C at end of shift   I personally performed the services described in this documentation, which was scribed in my presence. The recorded information  has been reviewed and is accurate.     Glade Nurse, PA-C 03/17/13 0023  Glade Nurse, PA-C 03/17/13 [phone]

## 2013-03-16 NOTE — ED Notes (Signed)
Pt has been seen and wand by security. 

## 2013-03-17 ENCOUNTER — Encounter (HOSPITAL_COMMUNITY): Payer: Self-pay | Admitting: Registered Nurse

## 2013-03-17 DIAGNOSIS — F200 Paranoid schizophrenia: Secondary | ICD-10-CM

## 2013-03-17 LAB — POCT I-STAT, CHEM 8
BUN: 7 mg/dL (ref 6–23)
Calcium, Ion: 1.1 mmol/L — ABNORMAL LOW (ref 1.12–1.23)
Chloride: 91 mEq/L — ABNORMAL LOW (ref 96–112)
Creatinine, Ser: 0.8 mg/dL (ref 0.50–1.35)

## 2013-03-17 MED ORDER — BENZTROPINE MESYLATE 1 MG PO TABS: 1.0000 mg | ORAL_TABLET | Freq: Every day | ORAL | Status: AC

## 2013-03-17 MED ORDER — LISINOPRIL 10 MG PO TABS
10.0000 mg | ORAL_TABLET | Freq: Every day | ORAL | Status: DC
  Administered 2013-03-17: 10 mg via ORAL
  Filled 2013-03-17 (×2): qty 1

## 2013-03-17 MED ORDER — CHLORPROMAZINE HCL 200 MG PO TABS: 200.0000 mg | ORAL_TABLET | Freq: Two times a day (BID) | ORAL | Status: AC | PRN

## 2013-03-17 MED ORDER — MIRTAZAPINE 30 MG PO TABS: 15.0000 mg | ORAL_TABLET | Freq: Every day | ORAL | Status: DC

## 2013-03-17 MED ORDER — MIRTAZAPINE 15 MG PO TABS: 15.0000 mg | ORAL_TABLET | Freq: Every day | ORAL | Status: AC

## 2013-03-17 MED ORDER — CALCIUM CARBONATE 1250 (500 CA) MG PO TABS
500.0000 mg | ORAL_TABLET | Freq: Every day | ORAL | Status: DC
  Administered 2013-03-17: 500 mg via ORAL
  Filled 2013-03-17 (×3): qty 1

## 2013-03-17 MED ORDER — SODIUM CHLORIDE 1 G PO TABS
2.0000 g | ORAL_TABLET | Freq: Once | ORAL | Status: AC
  Administered 2013-03-17: 2 g via ORAL
  Filled 2013-03-17: qty 2

## 2013-03-17 MED ORDER — CHLORPROMAZINE HCL 25 MG PO TABS: 200.0000 mg | ORAL_TABLET | Freq: Two times a day (BID) | ORAL | Status: DC | PRN

## 2013-03-17 MED ORDER — BENZTROPINE MESYLATE 1 MG PO TABS
1.0000 mg | ORAL_TABLET | Freq: Every day | ORAL | Status: DC
  Administered 2013-03-17: 1 mg via ORAL
  Filled 2013-03-17: qty 1

## 2013-03-17 MED ORDER — SODIUM CHLORIDE 1 G PO TABS: 3.0000 g | ORAL_TABLET | Freq: Once | ORAL | Status: DC

## 2013-03-17 NOTE — ED Notes (Signed)
Patient never asked for Thorazine prn today.

## 2013-03-17 NOTE — ED Provider Notes (Signed)
This is a Public relations account executive from [employer] at shift change: [name] is a 57 y.o. male BIBEMS for paranoid delusions and EtOH abuse c/o hyponatremia and vomiting. Hyponatermia is likely secondary to malnutrition. Plan is to Recheck Na. Pt is fefusing PO and vomiting.   Sodium has increased to 129.   Discussed case with attending who agrees with plan and stability.  Patient is medically cleared for psychiatric evaluation. Home meds continued, psychiatric holding orders placed.   Wynetta Emery, PA-C 03/17/13 [phone]

## 2013-03-17 NOTE — ED Notes (Signed)
Pt refusing blood draw at this time. MD made aware. States to try again at 630

## 2013-03-17 NOTE — ED Notes (Signed)
MD was made aware of pt's inability to keep "Salt tablets" down--- advice received to offer pt sandwich and snacks. Pt was offered sandwich and ginger ale; pt refused, states "I'll eat when I want to eat".

## 2013-03-17 NOTE — ED Provider Notes (Signed)
Medical screening examination/treatment/procedure(s) were performed by non-physician practitioner and as supervising physician I was immediately available for consultation/collaboration.  Renly Guedes, MD 03/17/13 1813 

## 2013-03-17 NOTE — BH Assessment (Signed)
Assessment Note   Patient is a 57 year old male with a history of schizophrenia.  Patient was brought to ED by [employer] after he called 911 saying that he saw people in his house with guns when he was initially brought to the ED.  [employer] ascertained that there was never anyone in his home.  Patient was open and cooperative throughout the assessment.      Patient reports several psychiatric hospitalizations due to psychosis.   Patient reports that when he has not had his medication he feels as if there are people out to get him.  Patient reports a family history of mental illness.  Patient denies a family history of suicide. Patient reports that he ran out of his medication 3 days ago.   Patient reports that he missed his appoint for his refill on 03/03/2013.    Patient denies SI/HI.  Patient denies substance abuse.  Patients UDS was negative and his BAL was <11.  Patient reports a prior history of substance abuse.  Patient reports that he stopped drinking in 1999 when he divorced his wife.  Patient denies a family history of substance abuse.    Axis I: Paranoid schizophrenia Axis II: Deferred Axis III:  Past Medical History  Diagnosis Date  . Schizophrenia   . 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 Substance abuse in remission   . Psoriasis     facial redness and flaking  . Back pain   . Hypertension    Axis IV: other psychosocial or environmental problems, problems related to social environment and problems with primary support group Axis V: 41-50 serious symptoms  Past Medical History:  Past Medical History  Diagnosis Date  . Schizophrenia   . 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 Substance abuse in remission   . Psoriasis     facial redness and flaking  . Back pain   . Hypertension     Past Surgical History  Procedure Laterality Date  . Lumbar disc surgery    . Anterior cervical decomp/discectomy fusion  04/27/2012    Procedure: ANTERIOR CERVICAL  DECOMPRESSION/DISCECTOMY FUSION 1 LEVEL;  Surgeon: Temple Pacini, MD;  Location: [facility];  Service: Neurosurgery;  Laterality: N/A;  Cervical Five-Six Anterior Cervical Decompression and Fusion    Family History:  Family History  Problem Relation Age of Onset  . Bipolar disorder Brother   . Drug abuse Brother   . Drug abuse Brother   . Bipolar disorder Brother   . Colon cancer Neg Hx   . Prostate cancer Maternal Uncle     Social History:  reports that he has been smoking Cigarettes.  He has a 12 pack-year smoking history. He has quit using smokeless tobacco. He reports that  drinks alcohol. He reports that he uses illicit drugs (Cocaine).  Additional Social History:     CIWA: CIWA-Ar BP: 124/72 mmHg Pulse Rate: 77 COWS:    Allergies: No Known Allergies  Home Medications:  (Not in a hospital admission)  OB/GYN Status:  No LMP for male patient.  General Assessment Data Location of Assessment: [facility] ED ACT Assessment: Yes Living Arrangements: Alone Can pt return to current living arrangement?: Yes Admission Status: Voluntary Is patient capable of signing voluntary admission?: Yes Transfer from: Acute Hospital Referral Source: Self/Family/Friend  Education Status Is patient currently in school?: No  Risk to self Suicidal Ideation: No Suicidal Intent: No Is  patient at risk for suicide?: No Suicidal Plan?: No Access to Means: No What has been your use of drugs/alcohol within the last 12 months?: None  Previous Attempts/Gestures: No How many times?: 0 Other Self Harm Risks: None Triggers for Past Attempts: None known Intentional Self Injurious Behavior: None Family Suicide History: No Recent stressful life event(s): Job Loss;Financial Problems;Divorce Persecutory voices/beliefs?: Yes Depression: Yes Depression Symptoms: Despondent;Isolating;Fatigue;Guilt;Feeling worthless/self pity;Loss of interest in usual pleasures Substance abuse history and/or treatment for  substance abuse?: No Suicide prevention information given to non-admitted patients: Not applicable  Risk to Others Homicidal Ideation: No Thoughts of Harm to Others: No Current Homicidal Intent: No Current Homicidal Plan: No Access to Homicidal Means: No Identified Victim: None History of harm to others?: No Assessment of Violence: None Noted Violent Behavior Description: calm Does patient have access to weapons?: No Criminal Charges Pending?: No Does patient have a court date: No  Psychosis Hallucinations: Auditory;Visual Delusions: None noted  Mental Status Report Appear/Hygiene: Disheveled;Poor hygiene;Other (Comment) (Flaking skin on face. ) Eye Contact: Fair Motor Activity: Freedom of movement Speech: Logical/coherent;Soft;Slow Level of Consciousness: Alert Mood: Empty;Despair Affect: Blunted Anxiety Level: None Thought Processes: Coherent;Relevant Judgement: Unimpaired Orientation: Person;Place;Time;Situation Obsessive Compulsive Thoughts/Behaviors: None  Cognitive Functioning Concentration: Decreased Memory: Recent Intact;Remote Intact IQ: Average Insight: Fair Impulse Control: Fair Appetite: Fair Weight Loss: 0 Weight Gain: 0 Sleep: Decreased Total Hours of Sleep: 3 Vegetative Symptoms: Decreased grooming  ADLScreening [facility] Assessment Services) Patient's cognitive ability adequate to safely complete daily activities?: Yes Patient able to express need for assistance with ADLs?: Yes Independently performs ADLs?: Yes (appropriate for developmental age)  Abuse/Neglect [facility]) Physical Abuse: Denies Verbal Abuse: Denies Sexual Abuse: Denies  Prior Inpatient Therapy Prior Inpatient Therapy: Yes Prior Therapy Dates: Patient reports that there have been too many to remember.  His last hospitalization was 6 months ago at [facility] for psychosis.  Prior Therapy Facilty/Provider(s): [facility] Reason for Treatment: Psychosis  Prior Outpatient Therapy Prior Outpatient  Therapy: Yes Prior Therapy Dates: ongoing Prior Therapy Facilty/Provider(s): Ty Hilts, PA Reason for Treatment: Medication Management and Outpatient Therapy   ADL Screening (condition at time of admission) Patient's cognitive ability adequate to safely complete daily activities?: Yes Patient able to express need for assistance with ADLs?: Yes Independently performs ADLs?: Yes (appropriate for developmental age)       Abuse/Neglect Assessment (Assessment to be complete while patient is alone) Physical Abuse: Denies Verbal Abuse: Denies Sexual Abuse: Denies Values / Beliefs Cultural Requests During Hospitalization: None Spiritual Requests During Hospitalization: None        Additional Information 1:1 In Past 12 Months?: No CIRT Risk: No Elopement Risk: No Does patient have medical clearance?: Yes     Disposition: Pending [facility] placement.  Disposition Initial Assessment Completed for this Encounter: Yes Disposition of Patient: Referred to Patient referred to: Other (Comment)  On Site Evaluation by:   Reviewed with Physician:     Phillip Heal LaVerne 03/17/2013 4:15 PM

## 2013-03-17 NOTE — Consult Note (Signed)
Reason for Consult:  Evaluation for inpatient treatment Referring Physician: EDP  [name] is an 57 y.o. male.  HPI:  Patient present to [facility] with complaints of seeing a person with a gun.  Patient denies SI/HI.  Patient states that he sees Liborio Nixon PA-C every two months but missed his appointment on 03/03/2013.  States that he ran out of medication 3 days ago and the hallucinations started.  Patient states that he only needs his medication and not inpatient treatment.  Spoke to [employer] and was informed that patient usually does well with medications as long as he is not doing drugs. Patient UDS is negative.  Agreed that prescriptions for patient medications and scheduled appointment to come back in to outpatient.    Past Medical History  Diagnosis Date  . Schizophrenia   . 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 Substance abuse in remission   . Psoriasis     facial redness and flaking  . Back pain   . Hypertension     Past Surgical History  Procedure Laterality Date  . Lumbar disc surgery    . Anterior cervical decomp/discectomy fusion  04/27/2012    Procedure: ANTERIOR CERVICAL DECOMPRESSION/DISCECTOMY FUSION 1 LEVEL;  Surgeon: Temple Pacini, MD;  Location: [facility];  Service: Neurosurgery;  Laterality: N/A;  Cervical Five-Six Anterior Cervical Decompression and Fusion    Family History  Problem Relation Age of Onset  . Bipolar disorder Brother   . Drug abuse Brother   . Drug abuse Brother   . Bipolar disorder Brother   . Colon cancer Neg Hx   . Prostate cancer Maternal Uncle     Social History:  reports that he has been smoking Cigarettes.  He has a 12 pack-year smoking history. He has quit using smokeless tobacco. He reports that  drinks alcohol. He reports that he uses illicit drugs (Cocaine).  Allergies: No Known Allergies  Medications: I have reviewed the patient's current medications.  Results for orders placed during the hospital  encounter of 03/16/13 (from the past 48 hour(s))  URINE RAPID DRUG SCREEN (HOSP PERFORMED)     Status: None   Collection Time    03/16/13  8:57 PM      Result Value Range   Opiates NONE DETECTED  NONE DETECTED   Cocaine NONE DETECTED  NONE DETECTED   Benzodiazepines NONE DETECTED  NONE DETECTED   Amphetamines NONE DETECTED  NONE DETECTED   Tetrahydrocannabinol NONE DETECTED  NONE DETECTED   Barbiturates NONE DETECTED  NONE DETECTED   Comment:            DRUG SCREEN FOR MEDICAL PURPOSES     ONLY.  IF CONFIRMATION IS NEEDED     FOR ANY PURPOSE, NOTIFY LAB     WITHIN 5 DAYS.                LOWEST DETECTABLE LIMITS     FOR URINE DRUG SCREEN     Drug Class       Cutoff (ng/mL)     Amphetamine      1000     Barbiturate      200     Benzodiazepine   200     Tricyclics       300     Opiates          300     Cocaine  300     THC              50  CBC WITH DIFFERENTIAL     Status: Abnormal   Collection Time    03/16/13  9:10 PM      Result Value Range   WBC 8.0  4.0 - 10.5 K/uL   RBC 4.12 (*) 4.22 - 5.81 MIL/uL   Hemoglobin 13.3  13.0 - 17.0 g/dL   HCT 16.1 (*) 09.6 - 04.5 %   MCV 85.7  78.0 - 100.0 fL   MCH 32.3  26.0 - 34.0 pg   MCHC 37.7 (*) 30.0 - 36.0 g/dL   Comment: RULED OUT INTERFERING SUBSTANCES   RDW 11.9  11.5 - 15.5 %   Platelets 223  150 - 400 K/uL   Neutrophils Relative % 67  43 - 77 %   Lymphocytes Relative 20  12 - 46 %   Monocytes Relative 12  3 - 12 %   Eosinophils Relative 1  0 - 5 %   Basophils Relative 0  0 - 1 %   Neutro Abs 5.3  1.7 - 7.7 K/uL   Lymphs Abs 1.6  0.7 - 4.0 K/uL   Monocytes Absolute 1.0  0.1 - 1.0 K/uL   Eosinophils Absolute 0.1  0.0 - 0.7 K/uL   Basophils Absolute 0.0  0.0 - 0.1 K/uL   WBC Morphology TOXIC GRANULATION    COMPREHENSIVE METABOLIC PANEL     Status: Abnormal   Collection Time    03/16/13  9:10 PM      Result Value Range   Sodium 120 (*) 135 - 145 mEq/L   Potassium 3.6  3.5 - 5.1 mEq/L   Chloride 83 (*) 96 - 112  mEq/L   CO2 26  19 - 32 mEq/L   Glucose, Bld 121 (*) 70 - 99 mg/dL   BUN 10  6 - 23 mg/dL   Creatinine, Ser 4.09  0.50 - 1.35 mg/dL   Calcium 9.6  8.4 - 81.1 mg/dL   Total Protein 7.7  6.0 - 8.3 g/dL   Albumin 4.0  3.5 - 5.2 g/dL   AST 21  0 - 37 U/L   ALT 16  0 - 53 U/L   Alkaline Phosphatase 106  39 - 117 U/L   Total Bilirubin 0.7  0.3 - 1.2 mg/dL   GFR calc non Af Amer >90  >90 mL/min   GFR calc Af Amer >90  >90 mL/min   Comment:            The eGFR has been calculated     using the CKD EPI equation.     This calculation has not been     validated in all clinical     situations.     eGFR's persistently     <90 mL/min signify     possible Chronic Kidney Disease.  ETHANOL     Status: None   Collection Time    03/16/13  9:10 PM      Result Value Range   Alcohol, Ethyl (B) <11  0 - 11 mg/dL   Comment:            LOWEST DETECTABLE LIMIT FOR     SERUM ALCOHOL IS 11 mg/dL     FOR MEDICAL PURPOSES ONLY  POCT I-STAT, CHEM 8     Status: Abnormal   Collection Time    03/16/13 10:57 PM      Result Value  Range   Sodium 122 (*) 135 - 145 mEq/L   Potassium 4.0  3.5 - 5.1 mEq/L   Chloride 85 (*) 96 - 112 mEq/L   BUN 9  6 - 23 mg/dL   Creatinine, Ser 1.61  0.50 - 1.35 mg/dL   Glucose, Bld 096 (*) 70 - 99 mg/dL   Calcium, Ion 0.45  4.09 - 1.23 mmol/L   TCO2 28  0 - 100 mmol/L   Hemoglobin 13.6  13.0 - 17.0 g/dL   HCT 81.1  91.4 - 78.2 %  POCT I-STAT, CHEM 8     Status: Abnormal   Collection Time    03/17/13  6:34 AM      Result Value Range   Sodium 129 (*) 135 - 145 mEq/L   Potassium 3.7  3.5 - 5.1 mEq/L   Chloride 91 (*) 96 - 112 mEq/L   BUN 7  6 - 23 mg/dL   Creatinine, Ser 9.56  0.50 - 1.35 mg/dL   Glucose, Bld 99  70 - 99 mg/dL   Calcium, Ion 2.13 (*) 1.12 - 1.23 mmol/L   TCO2 27  0 - 100 mmol/L   Hemoglobin 14.3  13.0 - 17.0 g/dL   HCT 08.6  57.8 - 46.9 %    No results found.  Review of Systems  Psychiatric/Behavioral: Positive for depression and hallucinations.  Negative for suicidal ideas.       Patient states that he usually does well with his medication.  "I ran out of medication 3 days ago."  All other systems reviewed and are negative.   Blood pressure 124/72, pulse 77, temperature 97.3 F (36.3 C), temperature source Oral, resp. rate 16, SpO2 98.00%. Physical Exam  Constitutional: He is oriented to person, place, and time.  HENT:  Head: Normocephalic and atraumatic.  Neck: Normal range of motion.  Respiratory: Effort normal.  Neurological: He is alert and oriented to person, place, and time.  Skin: Skin is warm and dry.  Psychiatric: His speech is normal. His mood appears not anxious. He is actively hallucinating. Thought content is paranoid. He does not exhibit a depressed mood. He expresses no homicidal and no suicidal ideation.    Assessment/Plan: Axis I: Schizophrenia, paranoid type  Recommendation:  Discharge home with one month refill on medication and schedule appointment to see Rae Halsted (out patient services [facility]).    Appointment set for February 20, 2013  At 2:45 PM  Assunta Found, FNP-BC 03/17/2013, 3:21 PM     I am agreed with the findings and involved in the treatment plan. Kathryne Sharper, MD

## 2013-03-17 NOTE — ED Provider Notes (Signed)
Medical screening examination/treatment/procedure(s) were performed by non-physician practitioner and as supervising physician I was immediately available for consultation/collaboration.   Gwyneth Sprout, MD 03/17/13 [phone]

## 2013-03-22 ENCOUNTER — Ambulatory Visit (HOSPITAL_COMMUNITY): Payer: Self-pay | Admitting: Physician Assistant

## 2013-07-15 ENCOUNTER — Other Ambulatory Visit: Payer: Self-pay

## 2013-09-21 ENCOUNTER — Emergency Department (HOSPITAL_COMMUNITY)
Admission: EM | Admit: 2013-09-21 | Discharge: 2013-09-21 | Disposition: A | Discharge status: Home / Self Care | Payer: Medicare HMO | Attending: Emergency Medicine | Admitting: Emergency Medicine

## 2013-09-21 ENCOUNTER — Encounter (HOSPITAL_COMMUNITY): Payer: Self-pay | Admitting: Emergency Medicine

## 2013-09-21 DIAGNOSIS — L408 Other psoriasis: Secondary | ICD-10-CM | POA: Insufficient documentation

## 2013-09-21 DIAGNOSIS — F17200 Nicotine dependence, unspecified, uncomplicated: Secondary | ICD-10-CM | POA: Insufficient documentation

## 2013-09-21 DIAGNOSIS — F209 Schizophrenia, unspecified: Secondary | ICD-10-CM | POA: Insufficient documentation

## 2013-09-21 DIAGNOSIS — Z79899 Other long term (current) drug therapy: Secondary | ICD-10-CM | POA: Insufficient documentation

## 2013-09-21 DIAGNOSIS — F1911 Other psychoactive substance abuse, in remission: Secondary | ICD-10-CM | POA: Insufficient documentation

## 2013-09-21 DIAGNOSIS — I10 Essential (primary) hypertension: Secondary | ICD-10-CM | POA: Insufficient documentation

## 2013-09-21 DIAGNOSIS — G4700 Insomnia, unspecified: Secondary | ICD-10-CM | POA: Insufficient documentation

## 2013-09-21 MED ORDER — LORAZEPAM 2 MG/ML IJ SOLN
2.0000 mg | Freq: Once | INTRAMUSCULAR | Status: AC
  Administered 2013-09-21: 2 mg via INTRAMUSCULAR
  Filled 2013-09-21: qty 1

## 2013-09-21 NOTE — ED Provider Notes (Signed)
CSN: [id number]     Arrival date & time 09/21/13  0132 History   First MD Initiated Contact with Patient 09/21/13 0151     Chief Complaint  Patient presents with  . Insomnia   (Consider location/radiation/quality/duration/timing/severity/associated sxs/prior Treatment) HPI  Hx per PT - Has h/o schizophrenia, presents complaining of difficulty sleeping the last 2 days and is requesting a prescription for wellbutrin.  He denies any drug or alcohol use. He denies any SI/ HI or hallucinations.  He is cooperative on exam but declines any blood work.  Symptoms mod in severity.   Past Medical History  Diagnosis Date  . Schizophrenia   . 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 Substance abuse in remission   . Psoriasis     facial redness and flaking  . Back pain   . Hypertension    Past Surgical History  Procedure Laterality Date  . Lumbar disc surgery    . Anterior cervical decomp/discectomy fusion  04/27/2012    Procedure: ANTERIOR CERVICAL DECOMPRESSION/DISCECTOMY FUSION 1 LEVEL;  Surgeon: Charlie Pitter, MD;  Location: [place] [facility];  Service: Neurosurgery;  Laterality: N/A;  Cervical Five-Six Anterior Cervical Decompression and Fusion   Family History  Problem Relation Age of Onset  . Bipolar disorder Brother   . Drug abuse Brother   . Drug abuse Brother   . Bipolar disorder Brother   . Colon cancer Neg Hx   . Prostate cancer Maternal Uncle    History  Substance Use Topics  . Smoking status: Current Every Day Smoker -- 1.00 packs/day for 12 years    Types: Cigarettes  . Smokeless tobacco: Former Systems developer  . Alcohol Use: Yes     Comment: rarely    Review of Systems  Constitutional: Negative for fever and chills.  Eyes: Negative for visual disturbance.  Respiratory: Negative for shortness of breath.   Cardiovascular: Negative for chest pain.  Gastrointestinal: Negative for vomiting and abdominal pain.  Genitourinary: Negative for dysuria.  Musculoskeletal:  Negative for back pain, neck pain and neck stiffness.  Skin: Negative for rash.  Neurological: Negative for headaches.  Psychiatric/Behavioral: Negative for suicidal ideas, hallucinations and self-injury.  All other systems reviewed and are negative.    Allergies  Review of patient's allergies indicates no known allergies.  Home Medications   Current Outpatient Rx  Name  Route  Sig  Dispense  Refill  . benztropine (COGENTIN) 1 MG tablet   Oral   Take 1 tablet (1 mg total) by mouth daily.   30 tablet   0     Dispense as written.   . calcium carbonate (OS-CAL) 600 MG TABS   Oral   Take 600 mg by mouth daily with breakfast. For bone health         . chlorproMAZINE (THORAZINE) 200 MG tablet   Oral   Take 1 tablet (200 mg total) by mouth 2 (two) times daily as needed (mood control).   60 tablet   0   . lisinopril (PRINIVIL,ZESTRIL) 10 MG tablet      Take 1 tablet daily: For blood pressure control   30 tablet   0   . mirtazapine (REMERON) 15 MG tablet   Oral   Take 1 tablet (15 mg total) by mouth at bedtime.   30 tablet   0    BP 120/57  Pulse 80  Temp(Src) 97 F (36.1 C) (Oral)  Resp 16  Wt 159 lb  2 oz (72.179 kg)  SpO2 99% Physical Exam  Constitutional: He is oriented to person, place, and time. He appears well-developed and well-nourished.  HENT:  Head: Normocephalic and atraumatic.  Eyes: EOM are normal. Pupils are equal, round, and reactive to light.  Neck: Neck supple.  Cardiovascular: Normal rate, regular rhythm and intact distal pulses.   Pulmonary/Chest: Effort normal and breath sounds normal. No respiratory distress. He exhibits no tenderness.  Abdominal: Soft. Bowel sounds are normal. He exhibits no distension. There is no tenderness.  Musculoskeletal: Normal range of motion. He exhibits no edema.  Neurological: He is alert and oriented to person, place, and time.  Skin: Skin is warm and dry.  Psychiatric:  Speech clear, somewhat bizarre  behavior behavior, no obvious hallucinations, no suicidal ideations    ED Course  Procedures (including critical care time) Labs Review Labs Reviewed - No data to display Imaging Review No results found.  EKG Interpretation   None      I offered TTS evaluation, PT insistent that he would like to something to help him sleep. He declines blood work. Ativan provided.   On recheck, has rested, still declines TTS or blood work, states he has follow up and would prefer to be discharged. Outpatient referrals provided. No indication for IVC at this time.   MDM   1. Insomnia     Dx: insomnia, h/o schizophrenia  Medication provided. Serial evaluations. VS and nurses notes reviewed and considered.     Teressa Lower, MD 09/21/13 2257

## 2013-09-21 NOTE — ED Notes (Signed)
Pt refused all care at this time.  Pt when prompted pt stated he did not want a prescription anymore, and that he did not need to give blood, a urine sample, or walk for staff.

## 2013-09-21 NOTE — ED Notes (Signed)
Attempted to ambulate pt.  Once again pt refused and stated he needed a few minutes

## 2013-09-21 NOTE — ED Notes (Addendum)
Pt still refusing blood draw, and to have vitals taken at this time.  Pt very lethargic, and would not open his eyes during conversation.

## 2013-09-21 NOTE — ED Notes (Signed)
Pt. reports unable to sleep for 2 days and requesting prescription for Wellbutrin , denies suicidal ideation . No visual/auditory hallucinations.

## 2013-09-21 NOTE — Discharge Instructions (Signed)
Insomnia Insomnia is frequent trouble falling and/or staying asleep. Insomnia can be a long term problem or a short term problem. Both are common. Insomnia can be a short term problem when the wakefulness is related to a certain stress or worry. Long term insomnia is often related to ongoing stress during waking hours and/or poor sleeping habits. Overtime, sleep deprivation itself can make the problem worse. Every little thing feels more severe because you are overtired and your ability to cope is decreased. CAUSES   Stress, anxiety, and depression.  Poor sleeping habits.  Distractions such as TV in the bedroom.  Naps close to bedtime.  Engaging in emotionally charged conversations before bed.  Technical reading before sleep.  Alcohol and other sedatives. They may make the problem worse. They can hurt normal sleep patterns and normal dream activity.  Stimulants such as caffeine for several hours prior to bedtime.  Pain syndromes and shortness of breath can cause insomnia.  Exercise late at night.  Changing time zones may cause sleeping problems (jet lag). It is sometimes helpful to have someone observe your sleeping patterns. They should look for periods of not breathing during the night (sleep apnea). They should also look to see how long those periods last. If you live alone or observers are uncertain, you can also be observed at a sleep clinic where your sleep patterns will be professionally monitored. Sleep apnea requires a checkup and treatment. Give your caregivers your medical history. Give your caregivers observations your family has made about your sleep.  SYMPTOMS   Not feeling rested in the morning.  Anxiety and restlessness at bedtime.  Difficulty falling and staying asleep. TREATMENT   Your caregiver may prescribe treatment for an underlying medical disorders. Your caregiver can give advice or help if you are using alcohol or other drugs for self-medication. Treatment  of underlying problems will usually eliminate insomnia problems.  Medications can be prescribed for short time use. They are generally not recommended for lengthy use.  Over-the-counter sleep medicines are not recommended for lengthy use. They can be habit forming.  You can promote easier sleeping by making lifestyle changes such as:  Using relaxation techniques that help with breathing and reduce muscle tension.  Exercising earlier in the day.  Changing your diet and the time of your last meal. No night time snacks.  Establish a regular time to go to bed.  Counseling can help with stressful problems and worry.  Soothing music and white noise may be helpful if there are background noises you cannot remove.  Stop tedious detailed work at least one hour before bedtime. HOME CARE INSTRUCTIONS   Keep a diary. Inform your caregiver about your progress. This includes any medication side effects. See your caregiver regularly. Take note of:  Times when you are asleep.  Times when you are awake during the night.  The quality of your sleep.  How you feel the next day. This information will help your caregiver care for you.  Get out of bed if you are still awake after 15 minutes. Read or do some quiet activity. Keep the lights down. Wait until you feel sleepy and go back to bed.  Keep regular sleeping and waking hours. Avoid naps.  Exercise regularly.  Avoid distractions at bedtime. Distractions include watching television or engaging in any intense or detailed activity like attempting to balance the household checkbook.  Develop a bedtime ritual. Keep a familiar routine of bathing, brushing your teeth, climbing into bed at the same  time each night, listening to soothing music. Routines increase the success of falling to sleep faster. °· Use relaxation techniques. This can be using breathing and muscle tension release routines. It can also include visualizing peaceful scenes. You can  also help control troubling or intruding thoughts by keeping your mind occupied with boring or repetitive thoughts like the old concept of counting sheep. You can make it more creative like imagining planting one beautiful flower after another in your backyard garden. °· During your day, work to eliminate stress. When this is not possible use some of the previous suggestions to help reduce the anxiety that accompanies stressful situations. °MAKE SURE YOU:  °· Understand these instructions. °· Will watch your condition. °· Will get help right away if you are not doing well or get worse. °Document Released: 08/23/2000 Document Revised: 11/18/2011 Document Reviewed: 09/23/2007 °ExitCare® Patient Information ©2014 [employer]. ° °Emergency Department Resource Guide °1) Find a Doctor and Pay Out of Pocket °Although you won't have to find out who is covered by your insurance plan, it is a good idea to ask around and get recommendations. You will then need to call the office and see if the doctor you have chosen will accept you as a new patient and what types of options they offer for patients who are self-pay. Some doctors offer discounts or will set up payment plans for their patients who do not have insurance, but you will need to ask so you aren't surprised when you get to your appointment. ° °2) Contact Your Local Health Department °Not all health departments have doctors that can see patients for sick visits, but many do, so it is worth a call to see if yours does. If you don't know where your local health department is, you can check in your phone book. The CDC also has a tool to help you locate your state's health department, and many state websites also have listings of all of their local health departments. ° °3) Find a Walk-in Clinic °If your illness is not likely to be very severe or complicated, you may want to try a walk in clinic. These are popping up all over the country in pharmacies, drugstores, and  shopping centers. They're usually staffed by nurse practitioners or physician assistants that have been trained to treat common illnesses and complaints. They're usually fairly quick and inexpensive. However, if you have serious medical issues or chronic medical problems, these are probably not your best option. ° °No Primary Care Doctor: °- Call Health Connect at  [phone] - they can help you locate a primary care doctor that  accepts your insurance, provides certain services, etc. °- Physician Referral Service- 1-800-533-3463 ° °Chronic Pain Problems: °Organization         Address  Phone   Notes  °[facility]  [phone] Patients need to be referred by their primary care doctor.  ° °Medication Assistance: °Organization         Address  Phone   Notes  °[place] [facility] [address] °[place], [place] [address] [phone] --Must be a resident of [place] °-- Must have NO insurance coverage whatsoever (no Medicaid/ Medicare, etc.) °-- The pt. MUST have a primary care doctor that directs their care regularly and follows them in the community °  °[facility]ist  (866) 331-1348   °United Way  (888) 892-1162   ° °Agencies that provide inexpensive medical care: °Organization           Address  Phone   Notes  [place]  [phone]   [facility]    [phone]   [facility] [place], [place] [address] [phone]   [place] [address] [place] [place] [phone]   Planned Parenthood    [phone]   [facility]    [phone]   [place] and [place] [address], [place] Phone:  [phone], Fax:  [phone] Hours of Operation:  9 am - 6 pm, M-F.  Also accepts Medicaid/Medicare and self-pay.  [facility] for [place], [address], [place] Phone: [phone],  Fax: [phone]. Hours of Operation:  8:30 am - 5:30 pm, M-F.  Also accepts Medicaid and self-pay.  [facility] [place] [address] [place] Phone: [phone]   [place], [place], [place] [phone], Ext. [phone] Mondays & Thursdays: 7-9 AM.  First 15 patients are seen on a first come, first serve basis.    [place] Providers:  Organization         Address  Phone   Notes  [facility] [address], [place] [phone] Also accepts self-pay patients.  [facility] [address], [place]  [phone]   [place], [address], [place] [phone]   [facility] [address] [place] [phone]   Lucianne Lei [address], [place]   [phone] Only accepts [place] Access [place] patients after they have their name applied to their card.   Self-Pay (no insurance) in [facility]:  Organization         Address  Phone   Notes  Sickle Cell Patients, [facility] [place] [phone]   [facility] [place] [phone]   [facility] [place]  [place], [place], [place] [phone]   [facility]/Dr. Osei-Bonsu  [address], [place] or [address], [place] [phone] Phone number for both [place] and [place] locations is the same.  [facility] [address] [place] [phone]   [facility] [address], [place] or [address] [phone]   [facility] [address], [place] [phone], phone; [phone], fax Sees patients 1st and 3rd Saturday of every month.  Must not qualify for public or private  insurance (i.e. Medicaid, Medicare, [place] Health Choice, Veterans' Benefits)  Household income should be no more than 200% of the poverty level The clinic cannot treat you if you are pregnant or think you are pregnant  Sexually transmitted diseases are not treated at the clinic.    Dental Care: Organization         Address  Phone  Notes  [facility] [place] [phone] Accepts children up to age 26 who are enrolled in [place] or [place]; pregnant women with a Medicaid card; and children who have applied for Medicaid or [place] Health Choice, but were declined, whose parents can pay a reduced fee at time  of service.  [facility] of [facility]  [address], [place] [phone] Accepts children up to age 25 who are enrolled in [place] or [place]; pregnant women with a Medicaid card; and children who have applied for Medicaid or [place] Health Choice, but were declined, whose parents can pay a reduced fee at time of service.  [place] Adult [facility] Access PRO[facility]  [place] [phone] Patients are seen by appointment only. Walk-ins are not accepted. [place] will see patients 40 years of age and older. Monday - Tuesday (8am-5pm) Most Wednesdays (8:30-5pm) $30 per visit, cash only  [facility] Adult Dental Access PROGRAM  [address], [facility] [phone] Patients are seen by appointment only. Walk-ins are not accepted. [place] will see patients 70 years of age and older. One Wednesday Evening (Monthly: Volunteer Based).  $30 per visit, cash only  [place]  [phone] for adults; Children under age 8, call Graduate Pediatric Dentistry at [phone]. Children aged 31-14, please call [phone] to request a pediatric application.  Dental services are provided in all areas of dental care  including fillings, crowns and bridges, complete and partial dentures, implants, gum treatment, root canals, and extractions. Preventive care is also provided. Treatment is provided to both adults and children. Patients are selected via a lottery and there is often a waiting list.   [facility] [address] [place]  [phone] [email or web address]   [facility] [address] [place], [place] [phone], Ext. [phone] Second and Fourth Thursday of each month, opens at 6:30 AM; Clinic ends at 9 AM.  Patients are seen on a first-come first-served basis, and a limited number are seen during each clinic.   [facility]  [address] Hillard Danker [place], [place] [phone]   Eligibility Requirements You must have lived in [place], [place], or [place] counties for at least the last three months.   You cannot be eligible for state or federal sponsored [employer], including [employer], [place], or [employer].   You generally cannot be eligible for healthcare insurance through your employer.    How to apply: Eligibility screenings are held every Tuesday and Wednesday afternoon from 1:00 pm until 4:00 pm. You do not need an appointment for the interview!  [facility] [address] [place], [place]   [place]  [place]  [place]  [phone]    Behavioral Health Resources in the Community: Intensive Outpatient Programs Organization         Address  Phone  Notes  Miller Brunson. [address] [place], [place] [phone]   [facility] [address], [place], [place]   ADS: Alcohol & Drug Svcs [address], [place], [place]   [place] [address]  [place], [place] or [phone]     Substance Abuse Resources Organization         Address  Phone  Notes  Alcohol and Drug Services  [phone]   [place]  [phone]   [facility] [place]   Chinita Pester  [phone]   Residential & Outpatient Substance Abuse Program  [phone]   Psychological Services Organization         Address  Phone  Notes  [facility] [place]  [place]  [phone]   [place] [address] [place] or [phone]    Mobile Crisis Teams Organization         Address  Phone  Notes  [facility], Mobile Crisis Care Unit  [phone]   [facility]  [address] [place], [place]   [place] Levels [address] [place] [phone]    Self-Help/Support Groups Organization         Address  Phone             Notes  [place] of [place] - variety of support groups  Emerald Call for more information  Narcotics Anonymous (NA), [facility] [address], [employer] [place]  2 meetings at this location   Special educational needs teacher         Address  Phone  Notes  [facility] [place],    [place]  [phone]   [facility]  [address], [place] [phone], [place], [place]   [place], [place] [phone] Admissions: 8am-3pm M-F  Incentives Substance [place] [address],    [place], [place] [phone]   [facility] [address] [place], [place], [place]   [facility] [address]  [place], [place]   [facility] - Intensive Outpatient [address], [place], [place]   [facility] ([place].) [place].,  [place], [place] [phone] or [phone]   Residential Treatment  Services (RTS) [address] [place], [place] Accepts Medicaid  [facility] [place] [address],  [place] [phone] Substance Abuse/Addiction Treatment   [facility] Organization         Address  Phone  Notes  [facility]  [phone]   Domenic Schwab, PhD [address] Arlis Porta [place], [place]   [phone] or [phone]   [place] [facility] [place], [place] [phone]   [facility] [address] [place], [place] [phone] Insurance/Medicaid/sponsorship through [facility] and Families [address]                                    [place], [place] [phone] [place] [address] [place], [place] [phone]    Dr. Adele Schilder  [phone]   [facility] Dept. 1) [address], [place] 2) [place] 3)  [place] [address], [place] [phone]  [phone]   [place] [phone] or [phone] (After Hours)

## 2013-09-21 NOTE — ED Notes (Signed)
Pt states that he has not been able to sleep for 2 days because he is to "worked up."  Pt states that he is tired of having to repeat the same information to sub-doctor people.  Pt states that all he wants is a prescription for Wellbutrin, and to be allowed to leave.  Pt states that he knows keeping him here is simply a fraud to get more money since "you bill by the minute."  Pt was able to be calmed after some effort from staff, but pt does not want to allow any additional assessment at this tim.  Pt states that he has taken Wellbutrin in the past for 60 days and it helped calm him down at night.

## 2013-09-21 NOTE — ED Notes (Signed)
Pt is very upset because he thinks that he is being charged by the hour and thinks that the er is not busy enough to warrant the doctor not simply writing him a prescription.  Pt was agreeable to receiving his medication, but is refusing to allow blood to be drawn at this time.  Pt was upset that the cap to the ativan vial was left on his bed and threw it at the staff.  Pt was made aware that this behavior would not be tolerated and he is not to be abusive to staff.  Pt ignored comments from staff, and simply stated that he did not know "what kinda place you are running here."

## 2013-09-21 NOTE — ED Notes (Signed)
Pt refused to ambulate, provide samples, or allow vital signs to be taken.

## 2013-09-21 NOTE — ED Notes (Signed)
Attempted to ambulate pt.  Pt refused.  Saying "give me a few minutes."

## 2013-10-08 ENCOUNTER — Inpatient Hospital Stay
Admit: 2013-10-08 | Discharge: 2013-10-18 | Disposition: A | Discharge status: Home / Self Care | Payer: MEDICARE | Attending: Geriatric Psychiatry | Admitting: Geriatric Psychiatry

## 2013-10-08 DIAGNOSIS — F200 Paranoid schizophrenia: Secondary | ICD-10-CM

## 2013-10-08 LAB — POC CHEM8
Anion gap (POC): 18 mmol/L — ABNORMAL HIGH (ref 5–15)
BUN (POC): 9 MG/DL (ref 9–20)
CO2 (POC): 27 MMOL/L (ref 21–32)
Calcium, ionized (POC): 1.17 MMOL/L (ref 1.12–1.32)
Chloride (POC): 91 MMOL/L — ABNORMAL LOW (ref 98–107)
Creatinine (POC): 0.6 MG/DL (ref 0.6–1.3)
GFRAA, POC: >60 mL/min/{1.73_m2} (ref >60)
GFRNA, POC: >60 mL/min/{1.73_m2} (ref >60)
Glucose (POC): 113 MG/DL — ABNORMAL HIGH (ref 75–110)
Hematocrit (POC): 36 % — ABNORMAL LOW (ref 36.6–50.3)
Hemoglobin (POC): 12.2 GM/DL (ref 12.1–17.0)
Potassium (POC): 3.9 MMOL/L (ref 3.5–5.1)
Sodium (POC): 131 MMOL/L — ABNORMAL LOW (ref 136–145)

## 2013-10-08 LAB — ETHYL ALCOHOL: ALCOHOL(ETHYL),SERUM: <10 MG/DL (ref <10)

## 2013-10-08 NOTE — ED Notes (Signed)
Verbal shift change report given to NOEL M TOWNES, RN (oncoming nurse) by B Barringer, RN (offgoing nurse). Report included the following information SBAR and ED Summary.

## 2013-10-08 NOTE — ED Notes (Signed)
Patient was brought in via EMS- pt states he is here to be evaluated for his schizophrenia- he states he is feeling increased confusion and agitation and feels he needs to be admitted and evaluated for medication.  Patient also admits to being homeless- he [place]to see if it was better." he states he has [place] on [place] streets [place] [employer] [place]

## 2013-10-08 NOTE — ED Provider Notes (Addendum)
HPI Comments: 58 yo male with h/o schizophrenia, unknown to this hospital, [place]/o "confusion", "I get confused between today and yesterday" and "difficulty telling subjective reality from objective reality". Pt states he has been without his medications for 1 year - states he has been on Prolixen and Clozaril. 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 States he graduated from "[employer] [place] and further notes, it has since changed its name and has become part of the [employer] system. Has two grown children with whom he has infrequent contact. States he has a brother. States he has been walking around [place] since being kicked out of the [employer] station where he had been staying. He found [facility] where EMS was called, and pt was delivered here.     Pt reports a h/o diabetes. Is reporting pain in both feet, which is chronic. Reports pain in right index finger where he has a cut. States he has slept a total of 4-5 hours in past 4 days. Denies A/V hallucinations, denies SI/HI.      Patient is a 58 y.o. male presenting with mental health disorder. The history is provided by the patient.   Mental Health Problem          Past Medical History   Diagnosis Date   ??? Psychiatric disorder      schizophrenia   ??? Diabetes    ??? Arthritis         Past Surgical History   Procedure Laterality Date   ??? Hx orthopaedic       back/lower/lumbar surgery         No family history on file.     History     Social History   ??? Marital Status: N/A     Spouse Name: N/A     Number of Children: N/A   ??? Years of Education: N/A     Occupational History   ??? Not on file.     Social History Main Topics   ??? Smoking status: Not on file   ??? Smokeless tobacco: Not on file   ??? Alcohol Use: Not on file    ??? Drug Use: Not on file   ??? Sexually Active: Not on file     Other Topics Concern   ??? Not on file     Social History Narrative   ??? No narrative on file                  ALLERGIES: Review of patient's allergies indicates no known allergies.      Review of Systems   Unable to perform ROS: Psychiatric disorder   Skin: Positive for wound.   All other systems reviewed and are negative.        Filed Vitals:    10/08/13 1657   BP: 158/90   Pulse: 84   Temp: 96.3 ??F (35.7 ??C)   Resp: 18   Height: 6\' 2"  (1.88 m)   Weight: 81.647 kg (180 lb)   SpO2: 99%            Physical Exam   Nursing note and vitals reviewed.  Constitutional: He is oriented to person, place, and time. He appears well-developed. No distress.   Disheveled, undernourished  man; somnolent but arousable   HENT:   Head: Normocephalic and atraumatic.   Mouth/Throat: Oropharynx is clear and moist.   Eyes: Conjunctivae and EOM are normal. Pupils are equal, round, and reactive to light. Right eye exhibits no discharge. Left eye exhibits no discharge.   Neck: Normal range of motion. Neck supple.   Cardiovascular: Normal rate, regular rhythm and intact distal pulses.  Exam reveals no gallop and no friction rub.    No murmur heard.  Pulmonary/Chest: Effort normal and breath sounds normal. No respiratory distress. He has no wheezes. He has no rales. He exhibits no tenderness.   Abdominal: Soft. He exhibits no distension. There is no tenderness.   Musculoskeletal: Normal range of motion. He exhibits no edema.   Neurological: He is alert and oriented to person, place, and time. No cranial nerve deficit. Coordination normal.   Skin: Skin is warm and dry. No rash noted. No erythema.   Large callous formation over dorsal aspect of 2nd digit PIP on right hand; central, linear laceration thru callous; smaller callous formation on left 2nd digit PIP; both hands erythematous, but not ttp, warm, fluctuant, PMS distally intact   Psychiatric: His affect is blunt. His speech is  rapid and/or pressured. He is agitated. He is not actively hallucinating. Thought content is not delusional. He expresses no homicidal and no suicidal ideation. He expresses no suicidal plans and no homicidal plans.        MDM     Differential Diagnosis; Clinical Impression; Plan:     DDX: psychosis, schizophrenia; chilbain, frostbite, callous/laceration  Amount and/or Complexity of Data Reviewed:   Clinical lab tests:  Ordered and reviewed   Discuss the patient with another provider:  Yes      Procedures    7:36 PM Pt has been agitated with nursing and tech staff. Pt threatening legal action and requesting an attorney if he is not provided something to read and a beverage. Pt states the noise and constant irritation by ER staff is adding to his confusion and he would like his medications and to see the psychiatrist or he would "seriously re-evaluate the worth of this institution". I advised that he would be sent up to see the psychiatrist as soon as some of his labs result, then provided pt with newspaper and a soda to drink. Lights were turned off. I advised RN and sherriff staff to limit interaction in pt's room. Pt has had no agitation or problems since these orders were given and staff has complied.     PLAN    -- ADMIT to behavior health acute

## 2013-10-08 NOTE — Behavioral Health Treatment Team (Signed)
nsg note  Client admitted to the unit on a volentary status to Dr Lelon HuhLakhani.  Client grouchy and at time loud, had [place] [employer] [place] and was bought here.  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  And has been on Prolixin and Cogentin in the past.  Completed admit, showered and fed.  MD notified, orders received.  Has area on knuckle of 2nd finger on his rt hand that he says was a paper cut, nothing addressed in the ED.  No other area of concern with body search.  Q 15 min checks for safety initiated.

## 2013-10-08 NOTE — ED Notes (Signed)
Patient walked out of his room and took the tray of food prior to providing urine

## 2013-10-08 NOTE — ED Provider Notes (Signed)
I was personally available for consult in the emergency department. I have reviewed the chart and agree with the documentation recorded by the MLP, including the assessment, treatment plan, and disposition.

## 2013-10-08 NOTE — Behavioral Health Treatment Team (Cosign Needed)
Pt did not attend Reflections Group

## 2013-10-08 NOTE — ED Notes (Signed)
TRANSFER - OUT REPORT:    Verbal report given to Lavone NeriJ Saunders, RN (name) on Troy Medina  [facility] for routine progression of care       Report consisted of patient???s Situation, Background, Assessment and   Recommendations(SBAR).     Information from the following report(s) SBAR and ED Summary was reviewed with the receiving nurse.    Opportunity for questions and clarification was provided.

## 2013-10-08 NOTE — Other (Signed)
Comprehensive Assessment Form Part 1    Section I - Disposition    Axis I   Schizophrenia, Undifferentiated Type    Nicotine Dependence  Axis II  Deferred  Axis III  Diabetes     Arthritis  Axis IV  Homeless    Lack of Structure    Problems related to social environment  Axis V 30      The Medical Doctor to Psychiatrist conference was completed.  The Medical Doctor is in agreement with Psychiatrist disposition because of (reason) pt meets inpatient admission criteria and is seeking inpatient hospitalization.  The plan is for pt to be admitted to behavioral health unit acute.  The on-call Psychiatrist consulted was Dr. Lelon HuhLakhani.  The admitting Psychiatrist will be Dr. Lelon HuhLakhani.  The admitting Diagnosis is Schizophrenia.  The Payor source is [employer].     Section II - Integrated Summary  Summary:  Pt is a 58 year old male who was brought to ED by EMS. Pt states he [place] to [place] seeking a new place to live. Pt reports he is homeless and has been sleeping a bus station since arriving in [place]. Pt states he was diagnosed with schizophrenia when he was 33 and discussed that his symptoms include hearing voices sometimes, confusion, and "difficulty telling subjective reality from objective reality". Pt reports he has been hospitalized on several occassions throughout the years in [place] and [place]. Pt reports he has slept a total of 4-5 hours in the past four days. Pt states he used to take medication for schizophrenia but has been off his medication for almost a year. Pt reported he has been on Prolixen and Clozaril in the past. Pt was alert and oriented and able to engage in conversation. Pt denied any suicidal or homicidal thoughts both currently and in the past. Pt displayed some insight into his symptoms and was considered to be a good historian, although he presented as significantly agitated throughout the interview. Pt is obviously unable to care for himself. Pt states he wants to  be hospitalized in order to restart his psychiatric medications. When asked if he could provide any contact information for his family pt gave this number [phone] which he stated was his "mother's residence".     The patienthas demonstrated mental capacity to provide informed consent.  The information is given by the patient and EMS personnel.  The Chief Complaint is mental health evaluation.  The Precipitant Factors are being off medication.  Previous Hospitalizations: pt states he has been hospitalized for psychiatric reasons on several occassions throughout the years in [place] and [place].  The patient has not previously been in restraints.  Current Psychiatrist and/or Case Manager is none.    Lethality Assessment:    The potential for suicide is not noted.  The potential for homicide is not noted.  The patient has not been a perpetrator of sexual or physical abuse.  There are not pending charges.  The patient is felt to be at risk for self harm or harm to others due to his inability to care for himself.      Section III - Psychosocial  The patient's overall mood and attitude is agitated.  Feelings of helplessness and hopelessness are not observed.  Generalized anxiety is not observed.  Panic is not observed. Phobias are not observed.  Obsessive compulsive tendencies are not observed.      Section IV - Mental Status Exam  The patient's appearance is unkempt and  shows poor hygiene.  The patient's behavior is agitated. The patient is oriented to time, place, person and situation.  The patient's speech shows no evidence of impairment.  The patient's mood is irritable.  The range of affect shows no evidence of impairment.  The patient's thought content demonstrates no evidence of impairment.  The thought process shows no evidence of impairment.  The patient's perception shows no evidence of impairment. The patient's memory is impaired.  The patient's appetite is decreased and shows signs of weight  loss.  The patient's sleep has evidence of insomnia. The patient's insight shows no evidence of impairment.  The patient's judgement shows no evidence of impairment.      Section V - Substance Abuse  The patient is using substances.  The patient is using tobacco by inhalation for greater than 10 years with last use on 10/07/13. The patient has experienced the following withdrawal symptoms: cravings.      Section VI - Living Arrangements  The patient is single.  The patient is homeless. The patient has two children ages 37 and 32.  The patient is homeless and currently does not have home.  The patient does not have legal issues pending. The patient's source of income comes from social security.  Religious and cultural practices have not been voiced at this time.    The patient's greatest support comes from his brother and this person will not be involved with the treatment.    The patient has not been in an event described as horrible or outside the realm of ordinary life experience either currently or in the past.  The patient has not been a victim of sexual/physical abuse.    Section VII - Other Areas of Clinical Concern  The highest grade achieved is college graduate with the overall quality of school experience being described as good.  The patient is currently disabled and speaks [place] as a primary language.  The patient has no communication impairments affecting communication. The patient's preference for learning can be described as: can read and write adequately.  The patient's hearing is normal.  The patient's vision is impaired and  wears glasses or contacts.      Patsy Lager, LCSW  Neita Goodnight MSW Intern

## 2013-10-08 NOTE — ED Notes (Signed)
Patient was given ginger ale by the provider

## 2013-10-08 NOTE — ED Notes (Signed)
Pt sleeping comfortably in bed.
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
## 2013-10-08 NOTE — ED Notes (Signed)
Pt sleeping in bed and appears in no distress.

## 2013-10-08 NOTE — ED Notes (Signed)
Patient to ED by EMS for mental health evaluation. Patient is cooperative but easily agitated during triage questioning. Patient states he has been more confused lately and states his balance is not good. Reports headache, back ache, and his feet hurt.

## 2013-10-09 MED ORDER — BENZTROPINE 1 MG TAB
1 mg | Freq: Every evening | ORAL | Status: DC
Start: 2013-10-09 — End: 2013-10-18
  Administered 2013-10-10 – 2013-10-18 (×9): via ORAL

## 2013-10-09 MED ORDER — BENZTROPINE 2 MG TAB
2 mg | Freq: Two times a day (BID) | ORAL | Status: DC | PRN
Start: 2013-10-09 — End: 2013-10-18

## 2013-10-09 MED ORDER — LORAZEPAM 1 MG TAB
1 mg | ORAL | Status: DC | PRN
Start: 2013-10-09 — End: 2013-10-18
  Administered 2013-10-10 – 2013-10-18 (×2): via ORAL

## 2013-10-09 MED ORDER — ZOLPIDEM 5 MG TAB
5 mg | Freq: Every evening | ORAL | Status: DC | PRN
Start: 2013-10-09 — End: 2013-10-18
  Administered 2013-10-12 – 2013-10-18 (×7): via ORAL

## 2013-10-09 MED ORDER — OLANZAPINE 5 MG TAB
5 mg | Freq: Four times a day (QID) | ORAL | Status: DC | PRN
Start: 2013-10-09 — End: 2013-10-18
  Administered 2013-10-12: 10:00:00 via ORAL

## 2013-10-09 MED ORDER — PNEUMOCOCCAL 23-VALPS VACCINE 25 MCG/0.5 ML INJECTION
25 mcg/0.5 mL | Freq: Once | INTRAMUSCULAR | Status: AC
Start: 2013-10-09 — End: 2013-10-09

## 2013-10-09 MED ORDER — ACETAMINOPHEN 325 MG TABLET
325 mg | ORAL | Status: DC | PRN
Start: 2013-10-09 — End: 2013-10-18

## 2013-10-09 MED ORDER — ZIPRASIDONE MESYLATE 20 MG IM SOLR
20 mg/mL (final conc.) | Freq: Two times a day (BID) | INTRAMUSCULAR | Status: DC | PRN
Start: 2013-10-09 — End: 2013-10-18

## 2013-10-09 MED ORDER — NICOTINE 21 MG/24 HR DAILY PATCH
21 mg/24 hr | Freq: Every day | TRANSDERMAL | Status: DC | PRN
Start: 2013-10-09 — End: 2013-10-18

## 2013-10-09 MED ORDER — FLUPHENAZINE 5 MG TAB
5 mg | Freq: Every evening | ORAL | Status: DC
Start: 2013-10-09 — End: 2013-10-12
  Administered 2013-10-10 – 2013-10-12 (×3): via ORAL

## 2013-10-09 MED ORDER — IBUPROFEN 400 MG TAB
400 mg | Freq: Three times a day (TID) | ORAL | Status: DC | PRN
Start: 2013-10-09 — End: 2013-10-18

## 2013-10-09 MED ORDER — BENZTROPINE 1 MG/ML IJ SOLN
1 mg/mL | Freq: Two times a day (BID) | INTRAMUSCULAR | Status: DC | PRN
Start: 2013-10-09 — End: 2013-10-18

## 2013-10-09 MED ORDER — LORAZEPAM 2 MG/ML IJ SOLN
2 mg/mL | INTRAMUSCULAR | Status: DC | PRN
Start: 2013-10-09 — End: 2013-10-18
  Administered 2013-10-13: 09:00:00 via INTRAMUSCULAR

## 2013-10-09 MED ORDER — MAGNESIUM HYDROXIDE 400 MG/5 ML ORAL SUSP
400 mg/5 mL | Freq: Every day | ORAL | Status: DC | PRN
Start: 2013-10-09 — End: 2013-10-18

## 2013-10-09 NOTE — H&P (Addendum)
History and Physical    Subjective:     Troy Medina is a 58 y.o. with past medical hx significant for DM and Arthritis. Pt presented to the hospital with confusion. He has hx of schizophrenia but has been off medication for one year. He is visiting [place] from [place].    Past Medical History   Diagnosis Date   ??? Arthritis    ??? Diabetes    ??? Psychiatric disorder      schizophrenia      Past Surgical History   Procedure Laterality Date   ??? Hx orthopaedic       back/lower/lumbar surgery     FHX: none declared by pt.    History   Substance Use Topics   ??? Smoking status: Current Every Day Smoker -- 1.00 packs/day   ??? Smokeless tobacco: None   ??? Alcohol Use: No       Prior to Admission medications    Not on File     No Known Allergies     Review of Systems:  Constitutional: negative  Eyes: negative  Ears, Nose, Mouth, Throat, and Face: negative  Respiratory: negative  Cardiovascular: negative  Gastrointestinal: negative  Genitourinary:negative  Integument/Breast: negative  Hematologic/Lymphatic: negative  Musculoskeletal:negative  Neurological: negative  Behavioral/Psychiatric: psychosis  Endocrine: negative  Allergic/Immunologic: negative     Objective:     Intake and Output:            Physical Exam:   BP 127/69   Pulse 76   Temp(Src) 97.1 ??F (36.2 ??C)   Resp 18   Ht 6\' 2"  (1.88 m)   Wt 81.647 kg (180 lb)   BMI 23.1 kg/m2   SpO2 99%  General:  Alert, cooperative, no distress, appears stated age.   Head:  Normocephalic, without obvious abnormality, atraumatic.   Eyes:  Conjunctivae/corneas clear. PERRL, EOMs intact.   Ears:  Normal external ear canals both ears.   Nose: Nares normal. Septum midline. Mucosa normal. No drainage or sinus tenderness.   Throat: Lips, mucosa, and tongue normal. Teeth and gums normal.   Neck: Supple, symmetrical, trachea midline, no adenopathy, thyroid: no enlargement/tenderness/nodules, no carotid bruit and no JVD.   Back:   Symmetric, no curvature. ROM normal. No CVA tenderness.   Lungs:    Clear to auscultation bilaterally.   Chest wall:  No tenderness or deformity.   Heart:  Regular rate and rhythm, S1, S2 normal, no murmur, click, rub or gallop.   Abdomen:   Soft, non-tender. Bowel sounds normal. No masses,  No organomegaly.   Extremities: 2-3+ edema   Pulses: 2+ and symmetric all extremities.   Skin: Skin color, texture, turgor normal. No rashes or lesions   Lymph nodes: Cervical, supraclavicular, and axillary nodes normal.   Neurologic: CNII-XII intact. Normal strength, sensation and reflexes throughout.       Data Review:   No results found for this or any previous visit (from the past 24 hour(s)).    Assessment:     Active Problems:    Psychosis (10/08/2013)      Schizophrenia (10/08/2013)    DM    Arthritis    Orthostatic edema    Plan:     Check hgba1c/sliding scale insulin.    Nsaid for pain.    No VTE prophylaxis indicated or necessary at this time.   Signed By: Chelsea PrimusSALMAN M Yuna Pizzolato, MD     October 09, 2013
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
## 2013-10-09 NOTE — Behavioral Health Treatment Team (Signed)
GROUP THERAPY PROGRESS NOTE    Richardson DoppKenneth Kittel is participating in Social Work Process Group. .     Group time: 45 minutes    Personal goal for participation: To get out of room. .     Goal orientation: personal    Group therapy participation: disruptive    Therapeutic interventions reviewed and discussed: Mental Health Discussion Q & A    Impression of participation: Could be disruptive by talking non-sensical but responded to redirection.     S. Rysinski, LCSW

## 2013-10-09 NOTE — Behavioral Health Treatment Team (Cosign Needed)
GROUP THERAPY PROGRESS NOTE    Troy DoppKenneth Medina is participating in [place] [facility].     Group time: 30 minutes    Personal goal for participation: reality orientation    Goal orientation: social    Group therapy participation: minimal    Therapeutic interventions reviewed and discussed: yes    Impression of participation: no problem

## 2013-10-09 NOTE — Progress Notes (Signed)
Problem: Altered Thought Process (Adult/Pediatric)  Goal: *STG: Remains safe in hospital  Outcome: Progressing Towards Goal  Pt rested quietly in bed with eyes closed.  No signs/symptoms of distress, agitation, or anxiety.  Will monitor for changes.  Q 15 minute checks continue. Pt slept 8 hrs

## 2013-10-09 NOTE — Behavioral Health Treatment Team (Signed)
Patient refused pneumococcal vaccine.

## 2013-10-09 NOTE — Progress Notes (Signed)
EDUCATIONAL GROUP NOTE    The patient Troy Medina is participating in Medication educational Teaching Group.     Group time: 30 minutes    Personal goal for participation: To review signs and symptoms of relapse; side effects    Goal orientation: personal    Group therapy participation: active    Therapeutic interventions reviewed and discussed: Yes    Impression of participation: good; verbalized understanding    Orland Mustardssence L Johnson, RN  10/09/2013

## 2013-10-09 NOTE — Behavioral Health Treatment Team (Signed)
Social Work Psychosocial Assessment     Pt is a 58 year old male who was [facility] after recently moving to [place], [place]. Pt said he was tired of living in [place] and, per ER note, came to [place] wanted to see if it was better. Pt is homeless and was kicked out of the [employer] [place] where he was sleeping. Pt says he has a history of schizophrenia but has been off meds for over a year. He has had several inpatient psych admissions in [place]. Pt says his mother's phone number is [phone] but she was not reached.     Pt is confused, intrusive and selectively mute. Pt is flat, depressed, and responding to internal stimuli.     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     Pt is currently homeless. It is unsure if pt will be returning to [place] or will be discharging to a shelter in [place]. He does not have a psychiatrist or therapist and has been non-med-compliant for over a year.     S. Rysinski, LCSW
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
## 2013-10-09 NOTE — Behavioral Health Treatment Team (Cosign Needed)
Pt observed in day room walking, in and out of cabinets, opening and closing room doors to general unit.  Pt walked out to the hall standing over staff shoulder looking at computer screen, pt asked to remove himself from area, intrusive and selective mute.  Pt verbalize no self disclosures at this time, attended groups with no problem, pt meals and meds compliant.  Pt didn't interact with peers or staff, affect flat, depressed mood, appear to be responding to internal stimuli at times.  Pt remain on Q 15 min checks for safety, will monitor and offer support when needed.
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
## 2013-10-10 LAB — GLUCOSE, POC
Glucose (POC): 193 mg/dL — ABNORMAL HIGH (ref 75–110)
Glucose (POC): 126 mg/dL — ABNORMAL HIGH (ref 75–110)
Glucose (POC): 89 mg/dL (ref 75–110)

## 2013-10-10 MED ORDER — DEXTROSE 50% IN WATER (D50W) IV SYRG
INTRAVENOUS | Status: DC | PRN
Start: 2013-10-10 — End: 2013-10-18

## 2013-10-10 MED ORDER — INSULIN LISPRO 100 UNIT/ML INJECTION
100 unit/mL | Freq: Four times a day (QID) | SUBCUTANEOUS | Status: DC
Start: 2013-10-10 — End: 2013-10-18

## 2013-10-10 MED ORDER — GLUCOSE 4 GRAM CHEWABLE TAB
4 gram | ORAL | Status: DC | PRN
Start: 2013-10-10 — End: 2013-10-18

## 2013-10-10 MED ORDER — GLUCAGON 1 MG INJECTION
1 mg | INTRAMUSCULAR | Status: DC | PRN
Start: 2013-10-10 — End: 2013-10-18

## 2013-10-10 MED FILL — LORAZEPAM 1 MG TAB: 1 mg | ORAL | Qty: 1

## 2013-10-10 MED FILL — FLUPHENAZINE 5 MG TAB: 5 mg | ORAL | Qty: 1

## 2013-10-10 MED FILL — BENZTROPINE 1 MG TAB: 1 mg | ORAL | Qty: 1

## 2013-10-10 NOTE — Behavioral Health Treatment Team (Signed)
Pt rested in bed with eyes closed for approximately 7 hours. No reports made by pt of having any difficulty sleeping while staff conducted rounds. Respirations even and unlabored. No acute distress noted. Was maintained on q15min checks. Will continue to monitor.

## 2013-10-10 NOTE — Behavioral Health Treatment Team (Signed)
Pt is a 58 year old male who [facility] after recently moving to [place]. Pt said he was tired of living in [place] and, per ER note, came to [place] wanted to see if it was better. Pt is homeless and was kicked out of the [employer] [place] where he was sleeping. Pt says he has a history of schizophrenia but has been off meds for over a year.   Yesterday pt. Was started on Prolixin 5 mg qhs and cogentin 1 mg qhs. Pt is compliant and reports no side effects. Self care and thought organization is improved from yesterday. Still remains intrusive on the unit requiring multiple redirections. Psych meds adjustment to continue.

## 2013-10-10 NOTE — Behavioral Health Treatment Team (Cosign Needed)
A&O to person, place, time, and situation.  Pt.'s self care skills are poor; dishleved.  Encouraged to shower; refused.  During this shift this pt. have been visible in the milieu to have needs met.  Pt.'s insight regarding mental illness and judgement is limited.  Pt. needs constant verbal redirection regarding touching television in the Dayroom, opening and closing door to General Unit and slamming cabinet doors in the Dayroom.  This pt. presents as being paranoid, responding, and irritable.  Pt. denies presence of A/V hallucinations and S/H ideations.  Offers support and anticipates needs.

## 2013-10-10 NOTE — Behavioral Health Treatment Team (Cosign Needed)
GROUP THERAPY PROGRESS NOTE    Troy Medina is participating in Goals Group.     Group time: 30 minutes    Personal goal for participation: to set a daily goal    Goal orientation: personal    Group therapy participation: minimal    Therapeutic interventions reviewed and discussed: yes    Impression of participation: needed prompting

## 2013-10-10 NOTE — Behavioral Health Treatment Team (Cosign Needed)
COMMUNITY GROUP THERAPY PROGRESS NOTE    The patient Troy Medina is participating in the Community Therapy Group.     Group time: 30 minutes    Personal goal for participation: To review unit guidelines and treatment plan    Goal orientation: personal    Group therapy participation: active    Therapeutic interventions reviewed and discussed: Yes    Impression of participation:  Positive input.    PAULETTE J ALLEN  10/10/2013

## 2013-10-10 NOTE — Behavioral Health Treatment Team (Signed)
Patient refused blood glucose monitor. No sign of hypo / hyperglycemia noted.

## 2013-10-10 NOTE — Behavioral Health Treatment Team (Signed)
Patient refused blood draw this morning.

## 2013-10-10 NOTE — Progress Notes (Signed)
General Daily Progress Note    Admit Date: 10/08/2013    Subjective:     Asked to see pt regarding a deep cut pt had received over PIP joint of middle finger. According to what pt is saying this cut occurred 4 months ago. He has full range of motion in his finger.    Current Facility-Administered Medications   Medication Dose Route Frequency   ??? fluPHENAZine (PROLIXIN) tablet 5 mg  5 mg Oral QHS   ??? benztropine (COGENTIN) tablet 1 mg  1 mg Oral QHS   ??? insulin lispro (HUMALOG) injection   SubCUTAneous AC&HS   ??? glucose chewable tablet 16 g  4 tablet Oral PRN   ??? dextrose (D50W) injection syrg 12.5-25 g  12.5-25 g IntraVENous PRN   ??? glucagon (GLUCAGEN) injection 1 mg  1 mg IntraMUSCular PRN   ??? ziprasidone (GEODON) 20 mg in sterile water (preservative free) injection  20 mg IntraMUSCular BID PRN   ??? OLANZapine (ZyPREXA) tablet 5 mg  5 mg Oral Q6H PRN   ??? benztropine (COGENTIN) tablet 2 mg  2 mg Oral BID PRN   ??? benztropine (COGENTIN) injection 2 mg  2 mg IntraMUSCular Q12H PRN   ??? LORazepam (ATIVAN) injection 2 mg  2 mg IntraMUSCular Q4H PRN   ??? LORazepam (ATIVAN) tablet 1 mg  1 mg Oral Q4H PRN   ??? zolpidem (AMBIEN) tablet 10 mg  10 mg Oral QHS PRN   ??? acetaminophen (TYLENOL) tablet 650 mg  650 mg Oral Q4H PRN   ??? ibuprofen (MOTRIN) tablet 400 mg  400 mg Oral Q8H PRN   ??? magnesium hydroxide (MILK OF MAGNESIA) oral suspension 30 mL  30 mL Oral DAILY PRN   ??? nicotine (NICODERM CQ) 21 mg/24 hr patch 1 patch  1 patch TransDERmal DAILY PRN   ??? [EXPIRED] pneumococcal 23-valent (PNEUMOVAX 23) injection 0.5 mL  0.5 mL IntraMUSCular ONCE            Objective:     Patient Vitals for the past 8 hrs:   BP Temp Pulse Resp   10/10/13 1600 138/84 mmHg 97.4 ??F (36.3 ??C) 76 16   10/10/13 1200 143/76 mmHg 97.4 ??F (36.3 ??C) 73 18             Physical Exam: BP 138/84   Pulse 76   Temp(Src) 97.4 ??F (36.3 ??C)   Resp 16   Ht 6\' 2"  (1.88 m)   Wt 81.647 kg (180 lb)   BMI 23.1 kg/m2   SpO2 99%  General:  Alert, cooperative, no distress,  appears stated age.   Head:  Normocephalic, without obvious abnormality, atraumatic.   Eyes:  Conjunctivae/corneas clear. PERRL, EOMs intact.            Lungs:   Clear to auscultation bilaterally.   Chest wall:  No tenderness or deformity.   Heart:  Regular rate and rhythm, S1, S2 normal, no murmur, click, rub or gallop.   Abdomen:   Soft, non-tender. Bowel sounds normal. No masses,  No organomegaly.           Extremities: Extremities normal, atraumatic, no cyanosis or edema. Deep cut middle finger PIP joint.   Pulses: 2+ and symmetric all extremities.   Skin: Skin color, texture, turgor normal. No rashes or lesions               Recent Results (from the past 24 hour(s))   GLUCOSE, POC    Collection Time  10/10/13  9:08 AM       Result Value Range    Glucose (POC) 193 (*) 75 - 110 mg/dL    Performed by Marylouise StacksMealy Monique     GLUCOSE, POC    Collection Time     10/10/13 12:25 PM       Result Value Range    Glucose (POC) 126 (*) 75 - 110 mg/dL    Performed by Marylouise StacksMealy Monique     GLUCOSE, POC    Collection Time     10/10/13  4:30 PM       Result Value Range    Glucose (POC) 89  75 - 110 mg/dL    Performed by Lucky RathkeAllen Paulette           Assessment:     Principal Problem:    Schizophrenia (10/08/2013)    Active Problems:    Psychosis (10/08/2013)    Deep cut over PIP joint middle finger right hand    Plan:     Too late to repair    Continue local wound care with antibiotic ointment and band aid.

## 2013-10-10 NOTE — Behavioral Health Treatment Team (Signed)
Seen by Dr Concha NorwayAkbar this shift, assessed R index finger. Noted calloused area at knuckle with old healed laceration. Per Dr Concha NorwayAkbar, no new orders. Encourage patient to keep a bandaid over area. Note as old deep laceration, patient states happened couple months ago. Presents went without treatment. No signs of reddness, drainage, odor, bruising or swelling to digit. Patient without complaints of pain. Refused bandaid at this time.

## 2013-10-11 LAB — GLUCOSE, POC
Glucose (POC): 111 mg/dL — ABNORMAL HIGH (ref 75–110)
Glucose (POC): 151 mg/dL — ABNORMAL HIGH (ref 75–110)

## 2013-10-11 MED FILL — FLUPHENAZINE 5 MG TAB: 5 mg | ORAL | Qty: 1

## 2013-10-11 MED FILL — BENZTROPINE 1 MG TAB: 1 mg | ORAL | Qty: 1

## 2013-10-11 NOTE — Behavioral Health Treatment Team (Signed)
Psychiatric Progress Note      Date: 10/11/2013  Account Number:  [id number]  Name: Troy DoppKenneth Privott      PSYCHOTHERAPY SESSION:  Length of psychotherapy session: 20 minutes  Main condition/diagnosis treated in session: compliace    Interpersonal relationship issues and psychodynamic conflicts explored.  Supportive psychotherapy provided in regards to various ongoing psychosocial stressors (including some of the following):   pre-admission and current problems   Housing issues   Occupational issues   Academic issues   Legal issues   Medical issues   Interpersonal conflicts   Stress of hospitalization              Worked on issues of denial & effects of substance dependency/use  Cognitive/Behavioral therapy provided  Reality-Oriented psychotherapy provided     Pt is not progressing    Extended energy and skill set needed to engage pt in psychotherapy due to the following: resistiveness of patient, complexity, resistance/negativity of patient, confrontational/hostile behaviors, and/or severe abnormalities in thought processes/psychosis resulting in the loss of expressive/receptive language communication skills.                                  E & M PROGRESS NOTE:         SUBJECTIVE:   CC: "I am Ok"    HPI/Interval History: (reviewed/updated 10/11/2013)  The patient Troy Medina is a 58 y.o. Single, White male with a history of Schizophrenia, Paranoid type, homelessness, no medical or SA issues  who reports/evidences the following emotional symptoms:  agitation, psychotic behavior and psychosis.  The symptoms have been present for years and are of high severity. The symptoms are constant /intermittent in nature.  Additional symptoms include difficulty sleeping and increased irritability and precipitated by  and psychosocial stressors (homelesess ).  Condition made worse by  treatment noncompliance.UDS -, BAL=0.  Patient is reported to be doing better. Compliant with medications. He was disheveled and flat in his affect.  Responses were linear and logical. Later on seen talking to self. Reported that he is here in [place] 8 days and on SSDI x years. Last treated for his CMI in [place] 18 months ago.     Review of Systems:  (reviewed/updated 10/11/2013)  Appetite:good   Sleep: good   All other Review of Systems: - General ROS: positive for  - poor hygiene  Psychological ROS: positive for - irritability and sleep disturbances  Cardiovascular ROS: no chest pain or dyspnea on exertion    Side Effects: (reviewed/updated 10/11/2013)  None reported or admitted to.      Past Medical History: (reviewed/updated 10/11/2013)  Active Ambulatory Problems     Diagnosis Date Noted   ??? No Active Ambulatory Problems     Resolved Ambulatory Problems     Diagnosis Date Noted   ??? No Resolved Ambulatory Problems     Past Medical History   Diagnosis Date   ??? Arthritis    ??? Diabetes    ??? Psychiatric disorder      Past medical history has been reviewed (see dictated report) with no additional updates (I asked patient and no additional past medical history provided).    Social History: (reviewed/updated 10/11/2013)   reports that he has been smoking.  He does not have any smokeless tobacco history on file. He reports that he does not drink alcohol or use illicit drugs.  History     Social History Narrative   ???  No narrative on file     Social history has been reviewed (see dictated report) with no additional updates (I asked patient and no additional social history provided).    Family History: (reviewed/updated 10/11/2013)  Family history has been reviewed (see dictated report) with no additional updates (I asked patient and no additional family history provided).    OBJECTIVE:                 MENTAL STATUS EXAM:  (reviewed/updated 10/11/2013 with changes noted below)  FINDINGS WITHIN NORMAL LIMITS (WNL) UNLESS OTHERWISE STATED BELOW:    Orientation oriented to time, place and person   Vital Signs (BP,Pulse, Temp) See below (reviewed)   Gait and Station Within normal limits    Abnormal Muscular Movements/Tone/Behavior No EPS, no Tardive Dyskinesia, no abnormal muscular movements; wnl tone   Relations cooperative   General Appearance:  Appears older than stated age, poor hygiene    Language No aphasia or dysarthria   Speech:  normal pitch and normal volume   Thought Processes logical, wnl rate of thoughts, poor abstract reasoning and computation   Thought Associations circumstantial and tangential   Thought Content internally preoccupied    Suicidal Ideations none   Homicidal Ideations none   Mood:  anxious   Affect:  blunted   Memory recent  adequate   Memory remote:  adequate   Concentration/Attention:  adequate   Fund of Knowledge Fair/average   Insight:  poor   Reliability poor   Judgment:  poor     Pertinent data/vitals:  Patient Vitals for the past 24 hrs:   Pulse Resp BP   10/11/13 0628 79 18 141/68 mmHg     Admission on 10/08/2013   Component Date Value Range Status   ??? ALCOHOL(ETHYL),SERUM 10/08/2013 <10  <10 MG/DL Final   ??? Calcium, ionized (POC) 10/08/2013 1.17  1.12 - 1.32 MMOL/L Final   ??? Sodium (POC) 10/08/2013 131* 136 - 145 MMOL/L Final   ??? Potassium (POC) 10/08/2013 3.9  3.5 - 5.1 MMOL/L Final   ??? Chloride (POC) 10/08/2013 91* 98 - 107 MMOL/L Final   ??? CO2 (POC) 10/08/2013 27  21 - 32 MMOL/L Final   ??? Anion gap (POC) 10/08/2013 18* 5 - 15 mmol/L Final   ??? Glucose (POC) 10/08/2013 113* 75 - 110 MG/DL Final   ??? BUN (POC) 60/45/4098 9  9 - 20 MG/DL Final   ??? Creatinine (POC) 10/08/2013 0.6  0.6 - 1.3 MG/DL Final   ??? GFR-AA (POC) 10/08/2013 >60  >60 ml/min/1.26m2 Final   ??? GFR, non-AA (POC) 10/08/2013 >60  >60 ml/min/1.47m2 Final   ??? Hemoglobin (POC) 10/08/2013 12.2  12.1 - 17.0 GM/DL Final   ??? Hematocrit (POC) 10/08/2013 36* 36.6 - 50.3 % Final   ??? Comment 10/08/2013 Comment Not Indicated.   Final   ??? Glucose (POC) 10/10/2013 193* 75 - 110 mg/dL Final   ??? Performed by 10/10/2013 Mealy Monique   Final   ??? Glucose (POC) 10/10/2013 126* 75 - 110 mg/dL Final   ??? Performed by  10/10/2013 Mealy Monique   Final   ??? Glucose (POC) 10/10/2013 89  75 - 110 mg/dL Final   ??? Performed by 10/10/2013 Lucky Rathke   Final   ??? Glucose (POC) 10/11/2013 111* 75 - 110 mg/dL Final   ??? Performed by 10/11/2013 Mealy Monique   Final   ??? Glucose (POC) 10/11/2013 151* 75 - 110 mg/dL Final   ??? Performed by 10/11/2013 Ilene Qua  Final         XR Results (most recent):  No results found for this or any previous visit.    Allergies:  No Known Allergies    Medications:  Current Facility-Administered Medications   Medication Dose Route Frequency   ??? fluPHENAZine (PROLIXIN) tablet 5 mg  5 mg Oral QHS   ??? benztropine (COGENTIN) tablet 1 mg  1 mg Oral QHS   ??? insulin lispro (HUMALOG) injection   SubCUTAneous AC&HS   ??? glucose chewable tablet 16 g  4 tablet Oral PRN   ??? dextrose (D50W) injection syrg 12.5-25 g  12.5-25 g IntraVENous PRN   ??? glucagon (GLUCAGEN) injection 1 mg  1 mg IntraMUSCular PRN   ??? ziprasidone (GEODON) 20 mg in sterile water (preservative free) injection  20 mg IntraMUSCular BID PRN   ??? OLANZapine (ZyPREXA) tablet 5 mg  5 mg Oral Q6H PRN   ??? benztropine (COGENTIN) tablet 2 mg  2 mg Oral BID PRN   ??? benztropine (COGENTIN) injection 2 mg  2 mg IntraMUSCular Q12H PRN   ??? LORazepam (ATIVAN) injection 2 mg  2 mg IntraMUSCular Q4H PRN   ??? LORazepam (ATIVAN) tablet 1 mg  1 mg Oral Q4H PRN   ??? zolpidem (AMBIEN) tablet 10 mg  10 mg Oral QHS PRN   ??? acetaminophen (TYLENOL) tablet 650 mg  650 mg Oral Q4H PRN   ??? ibuprofen (MOTRIN) tablet 400 mg  400 mg Oral Q8H PRN   ??? magnesium hydroxide (MILK OF MAGNESIA) oral suspension 30 mL  30 mL Oral DAILY PRN   ??? nicotine (NICODERM CQ) 21 mg/24 hr patch 1 patch  1 patch TransDERmal DAILY PRN       Scheduled Medications:  Current Facility-Administered Medications   Medication Dose Route Frequency   ??? fluPHENAZine (PROLIXIN) tablet 5 mg  5 mg Oral QHS   ??? benztropine (COGENTIN) tablet 1 mg  1 mg Oral QHS   ??? insulin lispro (HUMALOG) injection   SubCUTAneous  AC&HS         ASSESSMENT/PLAN:   Patient is still with a severe exacerbation of condition which is not improving  Diagnoses:(reviewed/updated 10/11/2013)  Patient Active Hospital Problem List:   Schizophrenia (10/08/2013)    Assessment: slow improvement. Poor insight, poor self care and responding to internal stimuli.     Plan: Continue Prolixin and cogentin.          A coordinated, multidisplinary treatment team round was conducted with the patient; that includes the nurse, unit pharmcist, Administrator all present.     The following regarding medications was addressed during rounds with patient:   the risks and benefits of the proposed medication. The patient was given the opportunity to ask questions. Informed consent given to the use of the above medications. Will continue to adjust psychiatric and non-psychiatric medications (see above "medication" section and orders section for details) as deemed appropriate & based upon diagnoses and response to treatment.     Will continue to order blood tests/labs and diagnostic tests as deemed appropriate and review results as they become available (see orders for details and above listed lab/test results).    Will order psychiatric records from previous psych hospitals to further elucidate the nature of patient's psychopathology and review once available.    Will gather additional collateral information from  friends, family and o/p treatment team to further elucidate the nature of patient's psychopathology and baselline level of psychiatric functioning.    Complete current  electronic health record for patient has been reviewed today including consultant notes, ancillary staff notes, nurses and psychiatric tech notes    Will continue to provide individual, milieu, occupational, group, and substance abuse therapies to address target symptoms as deemed appropriate for the individual patient.      EXPECTED DISCHARGE DATE (Day): TBD    DISPOSITION: Home ? shelter     Signed By: Adela Lank, MD  10/11/2013

## 2013-10-11 NOTE — Behavioral Health Treatment Team (Signed)
Patient rested in bed with eyes closed for the most part of the night. No complain or discomfort noted. Slept a total of 7 hours. Will continue to monitor.

## 2013-10-11 NOTE — Behavioral Health Treatment Team (Cosign Needed)
Pt is visible on the unit.  Pt is meal and med compliant.  Pt interacted appropriately with peers and staff.  Pt denies S/H ideations and A/V hallucinations at this time.  Pt remains on Q- 15 minute checks for safety.

## 2013-10-11 NOTE — Behavioral Health Treatment Team (Cosign Needed)
GROUP THERAPY PROGRESS NOTE    Troy Medina is participating in [facility].     Group time: 30 minutes    Personal goal for participation: daily orientation    Goal orientation: personal    Group therapy participation: minimal    Therapeutic interventions reviewed and discussed: yes    Impression of participation: needed prompting

## 2013-10-11 NOTE — Progress Notes (Signed)
Problem: Altered Thought Process (Adult/Pediatric)  Goal: *STG: Participates in treatment plan  Outcome: Progressing Towards Goal  Patient participated in treatment team, states does not want adjustment in meds. States he plans to stay in the area and rent a home. Patient presents as disheveled with poor hygiene. Mood irritable on approach, argumentative at times. Denies hallucinations. No self harm behaviors noted. Does seem paranoid. When asked about income hesitant in giving information. Compliant with meds and meals. Plan continue checks, assess thought processes and meds for effectiveness.

## 2013-10-11 NOTE — Progress Notes (Signed)
Patient refused noon accu check.

## 2013-10-11 NOTE — Behavioral Health Treatment Team (Signed)
Art Therapy Group    Troy DoppKenneth Medina attended Art Therapy Group    Group Time: 45 minutes    Treatment Goal for Each Participant: Use creative means to describe one change necessary for improved mental health.    Participation    minimal    Engagement with Peers   none     Impression of Participation: Unkempt.    Polite, fidgety, had much difficulty sitting still. Ripped up small pieces of paper and and stuffed them in his pockets.    Wrote out scientific terms. Mumbled to himself.

## 2013-10-11 NOTE — Behavioral Health Treatment Team (Cosign Needed)
GROUP THERAPY PROGRESS NOTE    The patient Troy Medina is participating in Reflection Group.    Group time: 30 minutes    Personal goal for participation: Follow up to goals group or reflection    Goal orientation:  To help patient be successful with daily goals    Group therapy participation: minimal    Therapeutic interventions reviewed and discussed: Yes    Impression of participation:  Pt had very good participation    DARRELL ROBERSON  10/11/2013 10:39 PM

## 2013-10-11 NOTE — Behavioral Health Treatment Team (Cosign Needed)
GROUP THERAPY PROGRESS NOTE    The patient Troy Medina a 58 y.o. male is participating in Coping Skills Group.     Group time: 45 minutes    Personal goal for participation: To participate in relaxation activity    Goal orientation:  relaxation    Group therapy participation: active    Therapeutic interventions reviewed and discussed: favorite ways to relax    Impression of participation:  The patient was attentive.    BEVERLY S BAKER  10/11/2013  12:58 PM

## 2013-10-11 NOTE — H&P (Signed)
INITIAL PSYCHIATRIC EVALUATION    IDENTIFICATION:      A.   Name: Troy Medina      B.   Age:     58 y.o.      C.   MRN: [id number]       D.   CSN:      [id number]      E.   Admission Date: 10/08/2013       F.   DOB:     07-Oct-1955                REASON FOR HOSPITALIZATION:  CC: "I have hard time differentiating objective from subjective reality". Pt admitted under voluntary basis for severe psychosis and an inability to care for self.     HISTORY OF PRESENT ILLNESS:    The patient Troy Medina is a 58 y.o.  male with a history of Schizophrenia  who reports/evidences the following emotional symptoms:  depression, agitation, psychotic behavior, paranoid behavior and psychosis.  The symptoms have been present for over a year and are of moderate severity. The symptoms are constant  in nature.  Additional symptoms include agitation and concern about health problems and precipitated by  psychosocial stressors .  Condition made worse by  treatment noncompliance.  BAL=0.  Per ED note26-  57 yo male with h/o schizophrenia, unknown to this hospital, recently from [place], [place] c/o "confusion", "I get confused between today and yesterday" and "difficulty telling subjective reality from objective reality". Pt states he has been without his medications for 1 year - states he has been on Prolixen and Clozaril. 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 States he graduated from "[employer] in [place] and further notes, it has since changed its name and has become part of the [employer] system. Has two grown children with whom he has infrequent contact. States he has a brother. States he has been walking around [place] since being kicked out of the [employer] station where he had been staying. He found  [facility] where EMS was called, and pt was delivered here.   Pt reports a h/o diabetes. Is reporting pain in both feet, which is chronic. Reports pain in right index finger where he has a cut. States he has slept a total of 4-5 hours in past 4 days. Denies A/V hallucinations, denies SI/HI.   Pt. Was seen upon arrival to the unit. Presented irritable, agitated easily, tired with poor attention and poor self care. Restarting of his Psych meds was discussed. He could not provide a reliable history. States he has never been on Clozaril. Agreed to start back on Prolixin at 5 mg qhs.     PAST MEDICAL HISTORY:  Active Ambulatory Problems     Diagnosis Date Noted   ??? No Active Ambulatory Problems     Resolved Ambulatory Problems     Diagnosis Date Noted   ??? No Resolved Ambulatory Problems     Past Medical History   Diagnosis Date   ??? Arthritis    ??? Diabetes    ??? Psychiatric disorder        ALLERGIES:  No Known Allergies    MEDICATIONS PRIOR TO ADMISSION:  No prescriptions prior to admission       SOCIAL HISTORY:  reports that he has been smoking.  He does not have any smokeless tobacco history on file. He reports that he does not drink alcohol or use illicit drugs.  History     Social History Narrative   ??? No narrative on file     Psychological trauma:       FAMILY HISTORY:   No family history of mental or substance use history reported. Family history of medical problems reviewed and are negative.    REVIEW OF SYSTEMS:  Psychological ROS: positive for - anxiety, concentration difficulties, depression and irritability  All other Systems reviewed and are considered negative      MENTAL STATUS EXAM:    FINDINGS WITHIN NORMAL LIMITS (WNL) UNLESS OTHERWISE STATED BELOW:    Orientation disorganized, oriented to time, place and person   Vital Signs (BP,Pulse, Temp) See below (reviewed)   Gait and Station Within normal limits   Abnormal Muscular Movements/Tone/Behavior No EPS, no Tardive Dyskinesia, no abnormal muscular movements;  wnl tone   Relations cooperative   General Appearance:  age appropriate, bearded, disheveled, malodorous and older than stated age   Language No aphasia or dysarthria   Speech:  dyasrthria, hyperverbal and loud   Thought Processes logical, wnl rate of thoughts,    Thought Associations circumstantial   Thought Content possible confabulation   Suicidal Ideations none   Homicidal Ideations none   Mood:  irritable   Affect:  anxious   Memory recent  impaired   Memory remote:  impaired   Concentration/Attention:  impaired   Fund of Knowledge Fair/average   Insight:  fair   Reliability poor   Judgment:  poor         VITALS:     Patient Vitals for the past 24 hrs:   Temp Pulse Resp BP   10/11/13 0628 - 79 18 141/68 mmHg   10/10/13 1600 97.4 ??F (36.3 ??C) 76 16 138/84 mmHg       PERTINENT DATA:  Labs Reviewed   POC CHEM8 - Abnormal; Notable for the following:     Sodium (POC) 131 (*)     Chloride (POC) 91 (*)     Anion gap (POC) 18 (*)     Glucose (POC) 113 (*)     Hematocrit (POC) 36 (*)     All other components within normal limits   GLUCOSE, POC - Abnormal; Notable for the following:     Glucose (POC) 193 (*)     All other components within normal limits   GLUCOSE, POC - Abnormal; Notable for the following:     Glucose (POC) 126 (*)     All other components within normal limits   GLUCOSE, POC - Abnormal; Notable for the following:     Glucose (POC) 111 (*)     All other components within normal limits   ETHYL ALCOHOL   HEMOGLOBIN A1C   GLUCOSE, POC   POC CHEM8     Admission on 10/08/2013   Component Date Value Range Status   ??? ALCOHOL(ETHYL),SERUM 10/08/2013 <10  <10 MG/DL Final   ??? Calcium, ionized (POC) 10/08/2013 1.17  1.12 - 1.32 MMOL/L Final   ??? Sodium (POC) 10/08/2013 131* 136 - 145 MMOL/L Final   ??? Potassium (POC) 10/08/2013 3.9  3.5 - 5.1 MMOL/L Final   ??? Chloride (POC) 10/08/2013 91* 98 - 107 MMOL/L Final   ??? CO2 (POC) 10/08/2013 27  21 - 32 MMOL/L Final   ??? Anion gap (POC) 10/08/2013 18* 5 -  15 mmol/L Final   ???  Glucose (POC) 10/08/2013 113* 75 - 110 MG/DL Final   ??? BUN (POC) 16/06/9603 9  9 - 20 MG/DL Final   ??? Creatinine (POC) 10/08/2013 0.6  0.6 - 1.3 MG/DL Final   ??? GFR-AA (POC) 10/08/2013 >60  >60 ml/min/1.51m2 Final   ??? GFR, non-AA (POC) 10/08/2013 >60  >60 ml/min/1.62m2 Final   ??? Hemoglobin (POC) 10/08/2013 12.2  12.1 - 17.0 GM/DL Final   ??? Hematocrit (POC) 10/08/2013 36* 36.6 - 50.3 % Final   ??? Comment 10/08/2013 Comment Not Indicated.   Final   ??? Glucose (POC) 10/10/2013 193* 75 - 110 mg/dL Final   ??? Performed by 10/10/2013 Mealy Monique   Final   ??? Glucose (POC) 10/10/2013 126* 75 - 110 mg/dL Final   ??? Performed by 10/10/2013 Mealy Monique   Final   ??? Glucose (POC) 10/10/2013 89  75 - 110 mg/dL Final   ??? Performed by 10/10/2013 Lucky Rathke   Final   ??? Glucose (POC) 10/11/2013 111* 75 - 110 mg/dL Final   ??? Performed by 10/11/2013 Mealy Monique   Final       XR Results (most recent):  No results found for this or any previous visit.    Medications:  Current Facility-Administered Medications   Medication Dose Route Frequency   ??? fluPHENAZine (PROLIXIN) tablet 5 mg  5 mg Oral QHS   ??? benztropine (COGENTIN) tablet 1 mg  1 mg Oral QHS   ??? insulin lispro (HUMALOG) injection   SubCUTAneous AC&HS   ??? glucose chewable tablet 16 g  4 tablet Oral PRN   ??? dextrose (D50W) injection syrg 12.5-25 g  12.5-25 g IntraVENous PRN   ??? glucagon (GLUCAGEN) injection 1 mg  1 mg IntraMUSCular PRN   ??? ziprasidone (GEODON) 20 mg in sterile water (preservative free) injection  20 mg IntraMUSCular BID PRN   ??? OLANZapine (ZyPREXA) tablet 5 mg  5 mg Oral Q6H PRN   ??? benztropine (COGENTIN) tablet 2 mg  2 mg Oral BID PRN   ??? benztropine (COGENTIN) injection 2 mg  2 mg IntraMUSCular Q12H PRN   ??? LORazepam (ATIVAN) injection 2 mg  2 mg IntraMUSCular Q4H PRN   ??? LORazepam (ATIVAN) tablet 1 mg  1 mg Oral Q4H PRN   ??? zolpidem (AMBIEN) tablet 10 mg  10 mg Oral QHS PRN   ??? acetaminophen (TYLENOL) tablet 650 mg  650 mg Oral Q4H PRN   ??? ibuprofen  (MOTRIN) tablet 400 mg  400 mg Oral Q8H PRN   ??? magnesium hydroxide (MILK OF MAGNESIA) oral suspension 30 mL  30 mL Oral DAILY PRN   ??? nicotine (NICODERM CQ) 21 mg/24 hr patch 1 patch  1 patch TransDERmal DAILY PRN       Scheduled Medications:  Current Facility-Administered Medications   Medication Dose Route Frequency   ??? fluPHENAZine (PROLIXIN) tablet 5 mg  5 mg Oral QHS   ??? benztropine (COGENTIN) tablet 1 mg  1 mg Oral QHS   ??? insulin lispro (HUMALOG) injection   SubCUTAneous AC&HS         ASSESSMENT/PLAN:  The patient Troy Medina is a 58 y.o.  male who presents at this time for treatment of the following diagnoses:    Patient Active Hospital Problem List:    Schizophrenia (10/08/2013)    Assessment: Labile and disorganized    Plan: Agreed to start back on Prolixin at 5 mg qhs. Says he is never been on Clozaril,  documentation states otherwise.      I agree with decision to admit patient. I have spoken to [facility] psychiatric assessor/ED staff regarding the nature of patients's admission at this time.    A coordinated, multidisplinary treatment team round was conducted with the patient; that includes the nurse, unit pharmcist, Administrator all present.     The following regarding medications was addressed during rounds with patient:   the risks and benefits of the proposed medication. The patient was given the opportunity to ask questions. Informed consent given to the use of the above medications.     I will continue to adjust psychiatric and non-psychiatric medications (see above "medication" section and orders section for details) as deemed appropriate & based upon diagnoses and response to treatment.     I have reviewed admission (and previous/old) labs and medical tests in the EHR and or transferring hospital documents. I will continue to order blood tests/labs and diagnostic tests as deemed appropriate and review results as they become available (see orders for details).    I have reviewed old  psychiatric and medical records available in the EHR. I Will order additional psychiatric records from other institutions to further elucidate the nature of patient's psychopathology and review once available.    I will gather additional collateral information from  friends, family and o/p treatment team to further elucidate the nature of patient's psychopathology and baselline level of psychiatric functioning.        While on the unit Ranell Skibinski will be provided with individual, milieu, occupational, group, and substance abuse therapies to address target symptoms as deemed appropriate for the individual patient.      ESTIMATED LENGTH OF STAY:   TBD days            STRENGTHS:  Motivated and ready for change and Patient optimistic that change can occur                  SIGNED:    Elby Showers, MD  10/11/2013

## 2013-10-12 LAB — GLUCOSE, POC
Glucose (POC): 120 mg/dL — ABNORMAL HIGH (ref 75–110)
Glucose (POC): 85 mg/dL (ref 75–110)
Glucose (POC): 80 mg/dL (ref 75–110)
Glucose (POC): 89 mg/dL (ref 75–110)

## 2013-10-12 MED ORDER — FLUPHENAZINE 5 MG TAB
5 mg | Freq: Every evening | ORAL | Status: DC
Start: 2013-10-12 — End: 2013-10-18
  Administered 2013-10-13 – 2013-10-18 (×6): via ORAL

## 2013-10-12 MED FILL — BENZTROPINE 1 MG TAB: 1 mg | ORAL | Qty: 1

## 2013-10-12 MED FILL — ZOLPIDEM 5 MG TAB: 5 mg | ORAL | Qty: 2

## 2013-10-12 MED FILL — FLUPHENAZINE 5 MG TAB: 5 mg | ORAL | Qty: 1

## 2013-10-12 MED FILL — ZYPREXA 5 MG TABLET: 5 mg | ORAL | Qty: 1

## 2013-10-12 NOTE — Behavioral Health Treatment Team (Signed)
GROUP THERAPY PROGRESS NOTE    Troy DoppKenneth Medina is participating in Coping Skills Group.     Group time: 20 minutes    Personal goal for participation: Reaching out for help/Having a Support System    Goal orientation: personal    Group therapy participation: active    Therapeutic interventions reviewed and discussed: yes    Impression of participation: good

## 2013-10-12 NOTE — Behavioral Health Treatment Team (Signed)
Pt has been pacing in hall several times, talking loud and needed redirection to lower voice and return to room. Pt is irritable and argumentative but followed direction.

## 2013-10-12 NOTE — Behavioral Health Treatment Team (Cosign Needed)
GROUP THERAPY PROGRESS NOTE    Troy Medina is participating in Community.     Group time: 30 minutes    Personal goal for participation: daily orientation    Goal orientation: personal    Group therapy participation: minimal    Therapeutic interventions reviewed and discussed: yes    Impression of participation: needed prompting

## 2013-10-12 NOTE — Behavioral Health Treatment Team (Addendum)
Assessed pt's left lower leg.1+ edema,warm to touch,pedal pulses present.Pt denies any pain.Range of motion done.Dr Concha NorwayAkbar paged.Will continue to monitor for s/s of infection and decreased circulation.Vital signs   temp 97.5, BP 134/81,HR 57 and respirations 18

## 2013-10-12 NOTE — Progress Notes (Signed)
General Daily Progress Note    Admit Date: 10/08/2013    Subjective:     Asked to see for LE edema. His left leg is more swollen than right. I advised him he should obtain dopplers of lower extremities but he refuses. He wants to be started on fluid pill.       Current Facility-Administered Medications   Medication Dose Route Frequency   ??? fluPHENAZine (PROLIXIN) tablet 10 mg  10 mg Oral QHS   ??? benztropine (COGENTIN) tablet 1 mg  1 mg Oral QHS   ??? insulin lispro (HUMALOG) injection   SubCUTAneous AC&HS   ??? glucose chewable tablet 16 g  4 tablet Oral PRN   ??? dextrose (D50W) injection syrg 12.5-25 g  12.5-25 g IntraVENous PRN   ??? glucagon (GLUCAGEN) injection 1 mg  1 mg IntraMUSCular PRN   ??? ziprasidone (GEODON) 20 mg in sterile water (preservative free) injection  20 mg IntraMUSCular BID PRN   ??? OLANZapine (ZyPREXA) tablet 5 mg  5 mg Oral Q6H PRN   ??? benztropine (COGENTIN) tablet 2 mg  2 mg Oral BID PRN   ??? benztropine (COGENTIN) injection 2 mg  2 mg IntraMUSCular Q12H PRN   ??? LORazepam (ATIVAN) injection 2 mg  2 mg IntraMUSCular Q4H PRN   ??? LORazepam (ATIVAN) tablet 1 mg  1 mg Oral Q4H PRN   ??? zolpidem (AMBIEN) tablet 10 mg  10 mg Oral QHS PRN   ??? acetaminophen (TYLENOL) tablet 650 mg  650 mg Oral Q4H PRN   ??? ibuprofen (MOTRIN) tablet 400 mg  400 mg Oral Q8H PRN   ??? magnesium hydroxide (MILK OF MAGNESIA) oral suspension 30 mL  30 mL Oral DAILY PRN   ??? nicotine (NICODERM CQ) 21 mg/24 hr patch 1 patch  1 patch TransDERmal DAILY PRN            Objective:     No data found.            Physical Exam: BP 134/81   Pulse 57   Temp(Src) 97.5 ??F (36.4 ??C)   Resp 18   Ht 6\' 2"  (1.88 m)   Wt 81.647 kg (180 lb)   BMI 23.1 kg/m2   SpO2 99%  General:  Alert, cooperative, no distress, appears stated age.   Head:  Normocephalic, without obvious abnormality, atraumatic.   Eyes:  Conjunctivae/corneas clear. PERRL, EOMs intact.           Lungs:   Clear to auscultation bilaterally.   Chest wall:  No tenderness or deformity.   Heart:   Regular rate and rhythm, S1, S2 normal, no murmur, click, rub or gallop.   Abdomen:   Soft, non-tender. Bowel sounds normal. No masses,  No organomegaly.           Extremities: Edema left greater than right.   Pulses: 2+ and symmetric all extremities.   Skin: Skin color, texture, turgor normal. No rashes or lesions               Recent Results (from the past 24 hour(s))   GLUCOSE, POC    Collection Time     10/12/13  7:41 AM       Result Value Range    Glucose (POC) 85  75 - 110 mg/dL    Performed by Jacquiline Doe     GLUCOSE, POC    Collection Time     10/12/13 11:37 AM       Result Value Range  Glucose (POC) 80  75 - 110 mg/dL    Performed by Jacquiline DoeWilliams [place]     GLUCOSE, POC    Collection Time     10/12/13  4:04 PM       Result Value Range    Glucose (POC) 89  75 - 110 mg/dL    Performed by Marylouise StacksMealy Monique     GLUCOSE, POC    Collection Time     10/12/13  8:21 PM       Result Value Range    Glucose (POC) 89  75 - 110 mg/dL    Performed by Leonie Manoebuck Corine           Assessment:     Principal Problem:    Schizophrenia (10/08/2013)    Active Problems:    Psychosis (10/08/2013)    rule out DVT    Orthostatic edema    Plan:     Pt refuses dopplers.    Start lasix 20mg  po qd.

## 2013-10-12 NOTE — Progress Notes (Signed)
Patient participated in treatment team.Visible on the unit. Patient presents as disheveled with poor hygiene. Mood irritable on approach, argumentative at times. Denies hallucinations. No self harm behaviors noted. Does seem paranoid. Compliant with meds and meals. Plan continue checks, assess thought processes and meds for effectiveness.

## 2013-10-12 NOTE — Behavioral Health Treatment Team (Signed)
Pt didn't attend Medication Education group.

## 2013-10-12 NOTE — Progress Notes (Signed)
Problem: Altered Thought Process (Adult/Pediatric)  Goal: *STG: Complies with medication therapy  Outcome: Progressing Towards Goal  Pt  Accepted evening scheduled medication. He was observed in the hallway at the nurses station angry and irritable while talking to the nurse. He was redirected back to the med room. He later came back and requested ambien 10 mg po for sleep

## 2013-10-12 NOTE — Behavioral Health Treatment Team (Signed)
Psychiatric Progress Note      Date: 10/12/2013  Account Number:  [id number]  Name: Troy Medina      PSYCHOTHERAPY SESSION:  Length of psychotherapy session: 20 minutes  Main condition/diagnosis treated in session: compliace    Interpersonal relationship issues and psychodynamic conflicts explored.  Supportive psychotherapy provided in regards to various ongoing psychosocial stressors (including some of the following):   pre-admission and current problems   Housing issues   Occupational issues   Academic issues   Legal issues   Medical issues   Interpersonal conflicts   Stress of hospitalization              Worked on issues of denial & effects of substance dependency/use  Cognitive/Behavioral therapy provided  Reality-Oriented psychotherapy provided     Pt is not progressing    Extended energy and skill set needed to engage pt in psychotherapy due to the following: resistiveness of patient, complexity, resistance/negativity of patient, confrontational/hostile behaviors, and/or severe abnormalities in thought processes/psychosis resulting in the loss of expressive/receptive language communication skills.                                  E & M PROGRESS NOTE:         SUBJECTIVE:   CC: "I am Ok"    HPI/Interval History: (reviewed/updated 10/12/2013)  The patient Troy Medina is a 58 y.o. Single, White male with a history of Schizophrenia, Paranoid type, homelessness, no medical or SA issues  who reports/evidences the following emotional symptoms:  agitation, psychotic behavior and psychosis.  The symptoms have been present for years and are of high severity. The symptoms are constant /intermittent in nature.  Additional symptoms include difficulty sleeping and increased irritability and precipitated by  and psychosocial stressors (homelesess ).  Condition made worse by  treatment noncompliance.UDS -, BAL=0.  Patient is reported to be good. Improved eye contact. Disruptive at night. Received PRN  Zyprexa x 1 . Compliant  with medications. He was disheveled and flat in his affect. Responses were linear and logical. Later on seen talking to self. Today wanted to know when he will be d/c.      Review of Systems:  (reviewed/updated 10/12/2013)  Appetite:good   Sleep: good   All other Review of Systems: - General ROS: positive for  - poor hygiene  Psychological ROS: positive for - irritability and sleep disturbances  Cardiovascular ROS: no chest pain or dyspnea on exertion    Side Effects: (reviewed/updated 10/12/2013)  None reported or admitted to.      Past Medical History: (reviewed/updated 10/12/2013)  Active Ambulatory Problems     Diagnosis Date Noted   ??? No Active Ambulatory Problems     Resolved Ambulatory Problems     Diagnosis Date Noted   ??? No Resolved Ambulatory Problems     Past Medical History   Diagnosis Date   ??? Arthritis    ??? Diabetes    ??? Psychiatric disorder      Past medical history has been reviewed (see dictated report) with no additional updates (I asked patient and no additional past medical history provided).    Social History: (reviewed/updated 10/12/2013)   reports that he has been smoking.  He does not have any smokeless tobacco history on file. He reports that he does not drink alcohol or use illicit drugs.  History     Social History Narrative   ??? No  narrative on file     Social history has been reviewed (see dictated report) with no additional updates (I asked patient and no additional social history provided).    Family History: (reviewed/updated 10/12/2013)  Family history has been reviewed (see dictated report) with no additional updates (I asked patient and no additional family history provided).    OBJECTIVE:                 MENTAL STATUS EXAM:  (reviewed/updated 10/12/2013 with changes noted below)  FINDINGS WITHIN NORMAL LIMITS (WNL) UNLESS OTHERWISE STATED BELOW:    Orientation oriented to time, place and person   Vital Signs (BP,Pulse, Temp) See below (reviewed)   Gait and Station Within normal limits    Abnormal Muscular Movements/Tone/Behavior No EPS, no Tardive Dyskinesia, no abnormal muscular movements; wnl tone   Relations cooperative   General Appearance:  Appears older than stated age, poor hygiene    Language No aphasia or dysarthria   Speech:  normal pitch and normal volume   Thought Processes logical, wnl rate of thoughts, poor abstract reasoning and computation   Thought Associations circumstantial and tangential   Thought Content internally preoccupied    Suicidal Ideations none   Homicidal Ideations none   Mood:  anxious   Affect:  blunted   Memory recent  adequate   Memory remote:  adequate   Concentration/Attention:  adequate   Fund of Knowledge Fair/average   Insight:  poor   Reliability poor   Judgment:  poor     Pertinent data/vitals:  Patient Vitals for the past 24 hrs:   Pulse Resp BP   10/11/13 1952 74 16 127/77 mmHg     Admission on 10/08/2013   Component Date Value Range Status   ??? ALCOHOL(ETHYL),SERUM 10/08/2013 <10  <10 MG/DL Final   ??? Calcium, ionized (POC) 10/08/2013 1.17  1.12 - 1.32 MMOL/L Final   ??? Sodium (POC) 10/08/2013 131* 136 - 145 MMOL/L Final   ??? Potassium (POC) 10/08/2013 3.9  3.5 - 5.1 MMOL/L Final   ??? Chloride (POC) 10/08/2013 91* 98 - 107 MMOL/L Final   ??? CO2 (POC) 10/08/2013 27  21 - 32 MMOL/L Final   ??? Anion gap (POC) 10/08/2013 18* 5 - 15 mmol/L Final   ??? Glucose (POC) 10/08/2013 113* 75 - 110 MG/DL Final   ??? BUN (POC) 62/13/086501/30/2015 9  9 - 20 MG/DL Final   ??? Creatinine (POC) 10/08/2013 0.6  0.6 - 1.3 MG/DL Final   ??? GFR-AA (POC) 10/08/2013 >60  >60 ml/min/1.3673m2 Final   ??? GFR, non-AA (POC) 10/08/2013 >60  >60 ml/min/1.1673m2 Final   ??? Hemoglobin (POC) 10/08/2013 12.2  12.1 - 17.0 GM/DL Final   ??? Hematocrit (POC) 10/08/2013 36* 36.6 - 50.3 % Final   ??? Comment 10/08/2013 Comment Not Indicated.   Final   ??? Glucose (POC) 10/10/2013 193* 75 - 110 mg/dL Final   ??? Performed by 10/10/2013 Mealy Monique   Final   ??? Glucose (POC) 10/10/2013 126* 75 - 110 mg/dL Final   ??? Performed by  10/10/2013 Mealy Monique   Final   ??? Glucose (POC) 10/10/2013 89  75 - 110 mg/dL Final   ??? Performed by 10/10/2013 Lucky RathkeAllen Paulette   Final   ??? Glucose (POC) 10/11/2013 111* 75 - 110 mg/dL Final   ??? Performed by 10/11/2013 Mealy Monique   Final   ??? Glucose (POC) 10/11/2013 151* 75 - 110 mg/dL Final   ??? Performed by 10/11/2013 Ilene QuaKEGBENRO Tajudeen  Final   ??? Glucose (POC) 10/11/2013 120* 75 - 110 mg/dL Final   ??? Performed by 10/11/2013 Wyatt Haste Tajudeen   Final   ??? Glucose (POC) 10/12/2013 85  75 - 110 mg/dL Final   ??? Performed by 10/12/2013 Jacquiline Doe   Final         XR Results (most recent):  No results found for this or any previous visit.    Allergies:  No Known Allergies    Medications:  Current Facility-Administered Medications   Medication Dose Route Frequency   ??? fluPHENAZine (PROLIXIN) tablet 10 mg  10 mg Oral QHS   ??? benztropine (COGENTIN) tablet 1 mg  1 mg Oral QHS   ??? insulin lispro (HUMALOG) injection   SubCUTAneous AC&HS   ??? glucose chewable tablet 16 g  4 tablet Oral PRN   ??? dextrose (D50W) injection syrg 12.5-25 g  12.5-25 g IntraVENous PRN   ??? glucagon (GLUCAGEN) injection 1 mg  1 mg IntraMUSCular PRN   ??? ziprasidone (GEODON) 20 mg in sterile water (preservative free) injection  20 mg IntraMUSCular BID PRN   ??? OLANZapine (ZyPREXA) tablet 5 mg  5 mg Oral Q6H PRN   ??? benztropine (COGENTIN) tablet 2 mg  2 mg Oral BID PRN   ??? benztropine (COGENTIN) injection 2 mg  2 mg IntraMUSCular Q12H PRN   ??? LORazepam (ATIVAN) injection 2 mg  2 mg IntraMUSCular Q4H PRN   ??? LORazepam (ATIVAN) tablet 1 mg  1 mg Oral Q4H PRN   ??? zolpidem (AMBIEN) tablet 10 mg  10 mg Oral QHS PRN   ??? acetaminophen (TYLENOL) tablet 650 mg  650 mg Oral Q4H PRN   ??? ibuprofen (MOTRIN) tablet 400 mg  400 mg Oral Q8H PRN   ??? magnesium hydroxide (MILK OF MAGNESIA) oral suspension 30 mL  30 mL Oral DAILY PRN   ??? nicotine (NICODERM CQ) 21 mg/24 hr patch 1 patch  1 patch TransDERmal DAILY PRN       Scheduled Medications:  Current  Facility-Administered Medications   Medication Dose Route Frequency   ??? fluPHENAZine (PROLIXIN) tablet 10 mg  10 mg Oral QHS   ??? benztropine (COGENTIN) tablet 1 mg  1 mg Oral QHS   ??? insulin lispro (HUMALOG) injection   SubCUTAneous AC&HS         ASSESSMENT/PLAN:   Patient is still with a severe exacerbation of condition which is not improving  Diagnoses:(reviewed/updated 10/12/2013)  Patient Active Hospital Problem List:   Schizophrenia (10/08/2013)    Assessment: slow improvement. Poor insight, poor self care and responding to internal stimuli.     Plan: Increase Prolixin 10 mg at bedtime. Continue cogentin          A coordinated, multidisplinary treatment team round was conducted with the patient; that includes the nurse, unit pharmcist, Administrator all present.     The following regarding medications was addressed during rounds with patient:   the risks and benefits of the proposed medication. The patient was given the opportunity to ask questions. Informed consent given to the use of the above medications. Will continue to adjust psychiatric and non-psychiatric medications (see above "medication" section and orders section for details) as deemed appropriate & based upon diagnoses and response to treatment.     Will continue to order blood tests/labs and diagnostic tests as deemed appropriate and review results as they become available (see orders for details and above listed lab/test results).    Will order psychiatric records  from previous psych hospitals to further elucidate the nature of patient's psychopathology and review once available.    Will gather additional collateral information from  friends, family and o/p treatment team to further elucidate the nature of patient's psychopathology and baselline level of psychiatric functioning.    Complete current electronic health record for patient has been reviewed today including consultant notes, ancillary staff notes, nurses and psychiatric tech  notes    Will continue to provide individual, milieu, occupational, group, and substance abuse therapies to address target symptoms as deemed appropriate for the individual patient.      EXPECTED DISCHARGE DATE (Day): TBD    DISPOSITION: Home ? shelter    Signed By: Adela Lank, MD  10/12/2013

## 2013-10-12 NOTE — Behavioral Health Treatment Team (Signed)
Dr Concha NorwayAkbar returned call.Dr Concha NorwayAkbar aware of leg assessment findings.Dr Concha NorwayAkbar to see pt this evening.Will continue to monitor pt.

## 2013-10-12 NOTE — Behavioral Health Treatment Team (Cosign Needed)
GROUP THERAPY PROGRESS NOTE    Troy DoppKenneth Medina is participating in evening Community meeting.     Group time: 30 minutes    Personal goal for participation: to reflect on unit rules    Goal orientation: personal    Group therapy participation: active    Therapeutic interventions reviewed and discussed: yes    Impression of participation: good

## 2013-10-12 NOTE — Behavioral Health Treatment Team (Cosign Needed)
GROUP THERAPY PROGRESS NOTE    The patient Troy Medina a 58 y.o. male is participating in Coping Skills Group.     Group time: 45 minutes    Personal goal for participation: To participate in relaxation activity    Goal orientation:  relaxation    Group therapy participation: minimal    Therapeutic interventions reviewed and discussed: favorite ways to relax    Impression of participation:  The patient was present-elected to watch    BEVERLY S BAKER  10/12/2013  1:10 PM

## 2013-10-12 NOTE — Behavioral Health Treatment Team (Signed)
Pt rested quietly in bed with eyes closed.  No signs/symptoms of distress.  Will monitor for changes.  Q 15 minute checks continue.  2 hour chart audit done. Slept  5   hours since 2200

## 2013-10-12 NOTE — Behavioral Health Treatment Team (Signed)
Pt has been up rambling around in his room, tearing up paper, talking to himself, given prn zyprexa.

## 2013-10-12 NOTE — Behavioral Health Treatment Team (Signed)
Pt was seen by Dr. Concha NorwayAkbar during evening rounds. Dr. Concha NorwayAkbar examined the left leg and offered doppler study to the pt. The pt refused doppler.  Pt stated instead said  he wanted a diuretic.

## 2013-10-13 LAB — GLUCOSE, POC
Glucose (POC): 89 mg/dL (ref 75–110)
Glucose (POC): 95 mg/dL (ref 75–110)
Glucose (POC): 87 mg/dL (ref 75–110)
Glucose (POC): 122 mg/dL — ABNORMAL HIGH (ref 75–110)

## 2013-10-13 MED ORDER — FUROSEMIDE 40 MG TAB
40 mg | Freq: Every day | ORAL | Status: DC
Start: 2013-10-13 — End: 2013-10-18
  Administered 2013-10-13 – 2013-10-18 (×6): via ORAL

## 2013-10-13 MED FILL — ZOLPIDEM 5 MG TAB: 5 mg | ORAL | Qty: 2

## 2013-10-13 MED FILL — FLUPHENAZINE 5 MG TAB: 5 mg | ORAL | Qty: 2

## 2013-10-13 MED FILL — BENZTROPINE 1 MG TAB: 1 mg | ORAL | Qty: 1

## 2013-10-13 MED FILL — FUROSEMIDE 40 MG TAB: 40 mg | ORAL | Qty: 1

## 2013-10-13 MED FILL — LORAZEPAM 2 MG/ML IJ SOLN: 2 mg/mL | INTRAMUSCULAR | Qty: 1

## 2013-10-13 NOTE — Behavioral Health Treatment Team (Signed)
GROUP THERAPY PROGRESS NOTE    Troy Medina is participating in [facility].     Group time: 30 minutes    Personal goal for participation: reality orientation    Goal orientation: community    Group therapy participation: minimal    Therapeutic interventions reviewed and discussed: yes    Impression of participation: fair

## 2013-10-13 NOTE — Behavioral Health Treatment Team (Signed)
Pt received Ativan 2 mg po for + 3 anxiety @ 0407 am.  Presently, Pt is in his room talk to himself but following calmer.  Pt slept approx 6.5 hrs during this night.  No c/o inability to sleep.  No respiratory distress noted.  Pt refused am lab work.

## 2013-10-13 NOTE — Behavioral Health Treatment Team (Signed)
GROUP THERAPY PROGRESS NOTE    The patient Troy Medina a 58 y.o. male is participating in Reflections Group    Group time: 30 minutes    Personal goal for participation: To discuss the daily goals    Goal orientation: personal    Group therapy participation: passive    Therapeutic interventions reviewed and discussed:  Yes    Impression of participation: fair    Darrel HooverJOHN E ROBINSON  10/13/2013  10:00 PM

## 2013-10-13 NOTE — Behavioral Health Treatment Team (Cosign Needed)
GROUP THERAPY PROGRESS NOTE    Troy Medina is participating in Community.     Group time: 30 minutes    Personal goal for participation: reality orientation    Goal orientation: social    Group therapy participation: minimal    Therapeutic interventions reviewed and discussed: yes    Impression of participation: no problem

## 2013-10-13 NOTE — Behavioral Health Treatment Team (Cosign Needed)
GROUP THERAPY PROGRESS NOTE    The patient Troy Medina a 58 y.o. male is participating in Coping Skills Group.     Group time: 45 minutes    Personal goal for participation: To participate in relaxation activity    Goal orientation:  relaxation    Group therapy participation: active    Therapeutic interventions reviewed and discussed: favorite ways to relax    Impression of participation:  The patient was attentive.    BEVERLY S BAKER  10/13/2013  12:53 PM

## 2013-10-13 NOTE — Behavioral Health Treatment Team (Signed)
Psychiatric Progress Note      Date: 10/13/2013  Account Number:  [id number]  Name: Troy Medina      PSYCHOTHERAPY SESSION:  Length of psychotherapy session: 20 minutes  Main condition/diagnosis treated in session: compliace    Interpersonal relationship issues and psychodynamic conflicts explored.  Supportive psychotherapy provided in regards to various ongoing psychosocial stressors (including some of the following):   pre-admission and current problems   Housing issues   Occupational issues   Academic issues   Legal issues   Medical issues   Interpersonal conflicts   Stress of hospitalization              Worked on issues of denial & effects of substance dependency/use  Cognitive/Behavioral therapy provided  Reality-Oriented psychotherapy provided     Pt is not progressing    Extended energy and skill set needed to engage pt in psychotherapy due to the following: resistiveness of patient, complexity, resistance/negativity of patient, confrontational/hostile behaviors, and/or severe abnormalities in thought processes/psychosis resulting in the loss of expressive/receptive language communication skills.                                  E & M PROGRESS NOTE:         SUBJECTIVE:   CC: "I am feeling drowsy as I got Ativan"    HPI/Interval History: (reviewed/updated 10/13/2013)  The patient Troy Medina is a 58 y.o. Single, White male with a history of Schizophrenia, Paranoid type, homelessness, no medical or SA issues  who reports/evidences the following emotional symptoms:  agitation, psychotic behavior and psychosis.  The symptoms have been present for years and are of high severity. The symptoms are constant /intermittent in nature.  Additional symptoms include difficulty sleeping and increased irritability and precipitated by  and psychosocial stressors (homelesess ).  Condition made worse by  treatment noncompliance.UDS -, BAL=0.  Patient is reported to be good. Improved eye contact. Disruptive at night. Received  PRN  Ativan  x 1 early AM today. Compliant with medications. He was disheveled and flat in his affect. Responses were linear and logical. Later on seen talking to self.  Today talked about going up [place] to his family in [place]. Gave consent to talk to his brother, Troy Medina in [place] for disposition. Hygiene continues to be poor.     Review of Systems:  (reviewed/updated 10/13/2013)  Appetite:good   Sleep: good   All other Review of Systems: - General ROS: positive for  - poor hygiene  Psychological ROS: positive for - irritability and sleep disturbances  Cardiovascular ROS: no chest pain or dyspnea on exertion    Side Effects: (reviewed/updated 10/13/2013)  None reported or admitted to.      Past Medical History: (reviewed/updated 10/13/2013)  Active Ambulatory Problems     Diagnosis Date Noted   ??? No Active Ambulatory Problems     Resolved Ambulatory Problems     Diagnosis Date Noted   ??? No Resolved Ambulatory Problems     Past Medical History   Diagnosis Date   ??? Arthritis    ??? Diabetes    ??? Psychiatric disorder      Past medical history has been reviewed (see dictated report) with no additional updates (I asked patient and no additional past medical history provided).    Social History: (reviewed/updated 10/13/2013)   reports that he has been smoking.  He does not have any smokeless tobacco  history on file. He reports that he does not drink alcohol or use illicit drugs.  History     Social History Narrative   ??? No narrative on file     Social history has been reviewed (see dictated report) with no additional updates (I asked patient and no additional social history provided).    Family History: (reviewed/updated 10/13/2013)  Family history has been reviewed (see dictated report) with no additional updates (I asked patient and no additional family history provided).    OBJECTIVE:                 MENTAL STATUS EXAM:  (reviewed/updated 10/13/2013 with changes noted below)  FINDINGS WITHIN NORMAL LIMITS (WNL) UNLESS OTHERWISE  STATED BELOW:    Orientation oriented to time, place and person   Vital Signs (BP,Pulse, Temp) See below (reviewed)   Gait and Station Within normal limits   Abnormal Muscular Movements/Tone/Behavior No EPS, no Tardive Dyskinesia, no abnormal muscular movements; wnl tone   Relations cooperative   General Appearance:  Appears older than stated age, poor hygiene    Language No aphasia or dysarthria   Speech:  normal pitch and normal volume   Thought Processes logical, wnl rate of thoughts, poor abstract reasoning and computation   Thought Associations More logical and linear.   Thought Content internally preoccupied    Suicidal Ideations none   Homicidal Ideations none   Mood:  anxious   Affect:  blunted   Memory recent  adequate   Memory remote:  adequate   Concentration/Attention:  adequate   Fund of Knowledge Fair/average   Insight:  poor   Reliability poor   Judgment:  poor     Pertinent data/vitals:  Patient Vitals for the past 24 hrs:   Temp Pulse Resp BP   10/12/13 1304 97.5 ??F (36.4 ??C) 57 18 134/81 mmHg   10/12/13 1234 - 88 18 120/84 mmHg     Admission on 10/08/2013   Component Date Value Range Status   ??? ALCOHOL(ETHYL),SERUM 10/08/2013 <10  <10 MG/DL Final   ??? Calcium, ionized (POC) 10/08/2013 1.17  1.12 - 1.32 MMOL/L Final   ??? Sodium (POC) 10/08/2013 131* 136 - 145 MMOL/L Final   ??? Potassium (POC) 10/08/2013 3.9  3.5 - 5.1 MMOL/L Final   ??? Chloride (POC) 10/08/2013 91* 98 - 107 MMOL/L Final   ??? CO2 (POC) 10/08/2013 27  21 - 32 MMOL/L Final   ??? Anion gap (POC) 10/08/2013 18* 5 - 15 mmol/L Final   ??? Glucose (POC) 10/08/2013 113* 75 - 110 MG/DL Final   ??? BUN (POC) 16/06/9603 9  9 - 20 MG/DL Final   ??? Creatinine (POC) 10/08/2013 0.6  0.6 - 1.3 MG/DL Final   ??? GFR-AA (POC) 10/08/2013 >60  >60 ml/min/1.64m2 Final   ??? GFR, non-AA (POC) 10/08/2013 >60  >60 ml/min/1.80m2 Final   ??? Hemoglobin (POC) 10/08/2013 12.2  12.1 - 17.0 GM/DL Final   ??? Hematocrit (POC) 10/08/2013 36* 36.6 - 50.3 % Final   ??? Comment 10/08/2013  Comment Not Indicated.   Final   ??? Glucose (POC) 10/10/2013 193* 75 - 110 mg/dL Final   ??? Performed by 10/10/2013 Mealy Monique   Final   ??? Glucose (POC) 10/10/2013 126* 75 - 110 mg/dL Final   ??? Performed by 10/10/2013 Mealy Monique   Final   ??? Glucose (POC) 10/10/2013 89  75 - 110 mg/dL Final   ??? Performed by 10/10/2013 Lucky Rathke   Final   ???  Glucose (POC) 10/11/2013 111* 75 - 110 mg/dL Final   ??? Performed by 10/11/2013 Mealy Monique   Final   ??? Glucose (POC) 10/11/2013 151* 75 - 110 mg/dL Final   ??? Performed by 10/11/2013 [facility]   Final   ??? Glucose (POC) 10/11/2013 120* 75 - 110 mg/dL Final   ??? [facility]formed [facility]11/2013 [facility]   Final   ??? Glucose (POC) 10/12/2013 85  75 - 110 mg/dL Final   ??? Performed by 10/12/2013 Jacquiline DoeWilliams [place]   Final   ??? Glucose (POC) 10/12/2013 80  75 - 110 mg/dL Final   ??? Performed by 10/12/2013 Jacquiline DoeWilliams [place]   Final   ??? Glucose (POC) 10/12/2013 89  75 - 110 mg/dL Final   ??? Performed by 10/12/2013 Mealy Monique   Final   ??? Glucose (POC) 10/12/2013 89  75 - 110 mg/dL Final   ??? Performed by 10/12/2013 Leonie Manoebuck Corine   Final   ??? Glucose (POC) 10/13/2013 95  75 - 110 mg/dL Final   ??? Performed by 10/13/2013 Mealy Monique   Final         XR Results (most recent):  No results found for this or any previous visit.    Allergies:  No Known Allergies    Medications:  Current Facility-Administered Medications   Medication Dose Route Frequency   ??? fluPHENAZine (PROLIXIN) tablet 10 mg  10 mg Oral QHS   ??? furosemide (LASIX) tablet 20 mg  20 mg Oral DAILY   ??? benztropine (COGENTIN) tablet 1 mg  1 mg Oral QHS   ??? insulin lispro (HUMALOG) injection   SubCUTAneous AC&HS   ??? glucose chewable tablet 16 g  4 tablet Oral PRN   ??? dextrose (D50W) injection syrg 12.5-25 g  12.5-25 g IntraVENous PRN   ??? glucagon (GLUCAGEN) injection 1 mg  1 mg IntraMUSCular PRN   ??? ziprasidone (GEODON) 20 mg in sterile water (preservative free) injection  20 mg IntraMUSCular BID PRN   ??? OLANZapine  (ZyPREXA) tablet 5 mg  5 mg Oral Q6H PRN   ??? benztropine (COGENTIN) tablet 2 mg  2 mg Oral BID PRN   ??? benztropine (COGENTIN) injection 2 mg  2 mg IntraMUSCular Q12H PRN   ??? LORazepam (ATIVAN) injection 2 mg  2 mg IntraMUSCular Q4H PRN   ??? LORazepam (ATIVAN) tablet 1 mg  1 mg Oral Q4H PRN   ??? zolpidem (AMBIEN) tablet 10 mg  10 mg Oral QHS PRN   ??? acetaminophen (TYLENOL) tablet 650 mg  650 mg Oral Q4H PRN   ??? ibuprofen (MOTRIN) tablet 400 mg  400 mg Oral Q8H PRN   ??? magnesium hydroxide (MILK OF MAGNESIA) oral suspension 30 mL  30 mL Oral DAILY PRN   ??? nicotine (NICODERM CQ) 21 mg/24 hr patch 1 patch  1 patch TransDERmal DAILY PRN       Scheduled Medications:  Current Facility-Administered Medications   Medication Dose Route Frequency   ??? fluPHENAZine (PROLIXIN) tablet 10 mg  10 mg Oral QHS   ??? furosemide (LASIX) tablet 20 mg  20 mg Oral DAILY   ??? benztropine (COGENTIN) tablet 1 mg  1 mg Oral QHS   ??? insulin lispro (HUMALOG) injection   SubCUTAneous AC&HS         ASSESSMENT/PLAN:   Patient is still with a severe exacerbation of condition which is not improving  Diagnoses:(reviewed/updated 10/13/2013)  Patient Active Hospital Problem List:   Schizophrenia (10/08/2013)    Assessment: slow improvement. Poor  insight, poor self care and responding to internal stimuli.     Plan: Continue Prolixin 10 mg at bedtime. Continue cogentin          A coordinated, multidisplinary treatment team round was conducted with the patient; that includes the nurse, unit pharmcist, Administrator all present.     The following regarding medications was addressed during rounds with patient:   the risks and benefits of the proposed medication. The patient was given the opportunity to ask questions. Informed consent given to the use of the above medications. Will continue to adjust psychiatric and non-psychiatric medications (see above "medication" section and orders section for details) as deemed appropriate & based upon diagnoses and  response to treatment.     Will continue to order blood tests/labs and diagnostic tests as deemed appropriate and review results as they become available (see orders for details and above listed lab/test results).    Will order psychiatric records from previous psych hospitals to further elucidate the nature of patient's psychopathology and review once available.    Will gather additional collateral information from  friends, family and o/p treatment team to further elucidate the nature of patient's psychopathology and baselline level of psychiatric functioning.    Complete current electronic health record for patient has been reviewed today including consultant notes, ancillary staff notes, nurses and psychiatric tech notes    Will continue to provide individual, milieu, occupational, group, and substance abuse therapies to address target symptoms as deemed appropriate for the individual patient.      EXPECTED DISCHARGE DATE (Day): TBD    DISPOSITION: Home  Team SW to call brother in [place] to discuss disposition.    Signed By: Adela Lank, MD  10/13/2013

## 2013-10-13 NOTE — Behavioral Health Treatment Team (Cosign Needed)
Pt appears to progressing with his treatment, doing much better than he'd previously had been. Pt has not appeared to be anxious, irritated or angry this shift. He in fact has been polite, asking nicely for what he may need. He also has been compliant with directives from staff; more easily redirectable than before. Appearance continues to be slightly disheveled, hygiene has improved. Has not been observed responding to internal stimuli, nor talking to himself. Compliant with all meals and medications; no behavioral management issues experienced. Will continue to provide necessary safety checks for pt per hospital protocol.

## 2013-10-13 NOTE — Behavioral Health Treatment Team (Addendum)
Patient has been calm and cooperative, sitting in common area reading paper and looking at TV most of shift. No inappropriate behaviors exhibited. Patient preoccupied at times observed staring in bizarre fashion as if he is hallucinating, no response when asked. Patient medicated with nicotine patch and sleeping pill per request. He has been meal and medication compliant. Affect is flat, monitored for safety.

## 2013-10-13 NOTE — Behavioral Health Treatment Team (Cosign Needed)
GROUP THERAPY PROGRESS NOTE    Troy DoppKenneth Medina is participating in Goals Group.     Group time: 30 minutes    Personal goal for participation: to set a daily goal    Goal orientation: personal    Group therapy participation: minimal    Therapeutic interventions reviewed and discussed: yes    Impression of participation: needed prompting

## 2013-10-13 NOTE — Behavioral Health Treatment Team (Signed)
Social Work     Met with pt in team and individually for treatment and discharge planning    Pt is thin in appearance, though exhibited a linear thought process and was quite engaged in discussing his options for discharge planning. He signed a ROI for his brother, Garreth Burnsworth, who resides in [place].  Worker contacted Remo Lipps and he said the pt could not reside with him or the pt's 65+ year old mother.  He noted he has not spoken to the pt in years and they have another brother who resides in [place].  The pt also has two adult children in [place], however, he does not and did not want worker to contact them.  The pt divorced 20 years ago and his brother said he has a long history of using hard drugs, such as heroin and crack-cocaine.  He said the pt receives $50,000 in tax-free income from disability and is fully capable of finding his own housing and resources if he so chooses.     Delight Stare, LCSW

## 2013-10-13 NOTE — Progress Notes (Signed)
Spiritual Care Assessment/Progress Notes    Troy Medina [id number]  [id number]    August 31, 1956  58 y.o.  male    Patient Telephone Number: [phone] (home)   Religious Affiliation: Unknown   Language: English   Extended Emergency Contact Information  Primary Emergency Contact: [employer] [place]  Relation: None   Patient Active Problem List    Diagnosis Date Noted   ??? Psychosis 10/08/2013   ??? Schizophrenia 10/08/2013        Date: October 13, 2013       Level of Religious/Spiritual Activity:  []              Involved in faith tradition/spiritual practice    []              Not involved in faith tradition/spiritual practice  [x]              Spiritually oriented    []              Claims no spiritual orientation    []              seeking spiritual identity  []              Feels alienated from religious practice/tradition  []              Feels angry about religious practice/tradition  [x]              Spirituality/religious tradition is a Theatre stage manager for coping at this time.  []              Not able to assess due to medical condition    Services Provided Today:  []              crisis intervention    []              reading Scriptures  [x]              spiritual assessment    [x]              prayer  [x]              empathic listening/emotional support  []              rites and rituals (cite in comments)  []              life review     []              religious support  []              theological development   []              advocacy  []              ethical dialog     []              blessing  []              bereavement support    []              support to family  []              anticipatory grief support   []              help with AMD  []              spiritual guidance    []              meditation  Spiritual Care Needs  []              Emotional Support  []              Spiritual/Religious Care  []              Loss/Adjustment  []              Advocacy/Referral /Ethics  [x]              No needs expressed  at this time  []              Other: (note in comments)  Spiritual Care Plan  []              Follow up visits with pt/family  []              Provide materials  []              Schedule sacraments  []              Contact Community Clergy  [x]              Follow up as needed  []              Other: (note in comments)     Comments: The pt shared some practical issues which he need help with.   Pt was given some practical advice as well as suggested to the Child psychotherapistsocial worker.  On two other issues, chaplain discussed with floor nurse and it appeared there was the possibility of some resolution.      Troy Odoravid H. Alannah Averhart, MA, MTS, BCC, Staff [place]

## 2013-10-13 NOTE — Behavioral Health Treatment Team (Cosign Needed)
GROUP THERAPY PROGRESS NOTE    The patient Troy Medina a 58 y.o. male is participating in Creative Expression Group.     Group time: 1 hour    Personal goal for participation: To concentrate on selected task    Goal orientation: social    Group therapy participation: active    Therapeutic interventions reviewed and discussed: Crafts, games, music    Impression of participation: The patient was attentive.    BEVERLY S BAKER  10/13/2013  4:42 PM

## 2013-10-14 LAB — GLUCOSE, POC
Glucose (POC): 102 mg/dL (ref 75–110)
Glucose (POC): 81 mg/dL (ref 75–110)
Glucose (POC): 130 mg/dL — ABNORMAL HIGH (ref 75–110)
Glucose (POC): 76 mg/dL (ref 75–110)
Glucose (POC): 108 mg/dL (ref 75–110)

## 2013-10-14 LAB — DRUG SCREEN, URINE
AMPHETAMINES: NEGATIVE
BARBITURATES: NEGATIVE
BENZODIAZEPINES: NEGATIVE
COCAINE: NEGATIVE
METHADONE: NEGATIVE
OPIATES: NEGATIVE
PCP(PHENCYCLIDINE): NEGATIVE
THC (TH-CANNABINOL): NEGATIVE

## 2013-10-14 LAB — METABOLIC PANEL, COMPREHENSIVE
A-G Ratio: 1 — ABNORMAL LOW (ref 1.1–2.2)
ALT (SGPT): 40 U/L (ref 12–78)
AST (SGOT): 25 U/L (ref 15–37)
Albumin: 2.6 g/dL — ABNORMAL LOW (ref 3.5–5.0)
Alk. phosphatase: 99 U/L (ref 45–117)
Anion gap: 6 mmol/L (ref 5–15)
BUN/Creatinine ratio: 27 — ABNORMAL HIGH (ref 12–20)
BUN: 14 MG/DL (ref 6–20)
Bilirubin, total: 0.3 MG/DL (ref 0.2–1.0)
CO2: 29 mmol/L (ref 21–32)
Calcium: 8 MG/DL — ABNORMAL LOW (ref 8.5–10.1)
Chloride: 103 mmol/L (ref 97–108)
Creatinine: 0.51 MG/DL (ref 0.45–1.15)
GFR est AA: >60 mL/min/{1.73_m2} (ref >60)
GFR est non-AA: >60 mL/min/{1.73_m2} (ref >60)
Globulin: 2.7 g/dL (ref 2.0–4.0)
Glucose: 85 mg/dL (ref 65–100)
Potassium: 4 mmol/L (ref 3.5–5.1)
Protein, total: 5.3 g/dL — ABNORMAL LOW (ref 6.4–8.2)
Sodium: 138 mmol/L (ref 136–145)

## 2013-10-14 LAB — HEMOGLOBIN A1C WITH EAG
Est. average glucose: 111 mg/dL
Hemoglobin A1c: 5.5 % (ref 4.2–6.3)

## 2013-10-14 MED FILL — ZOLPIDEM 5 MG TAB: 5 mg | ORAL | Qty: 2

## 2013-10-14 MED FILL — BENZTROPINE 1 MG TAB: 1 mg | ORAL | Qty: 1

## 2013-10-14 MED FILL — FLUPHENAZINE 5 MG TAB: 5 mg | ORAL | Qty: 2

## 2013-10-14 MED FILL — NICOTINE 21 MG/24 HR DAILY PATCH: 21 mg/24 hr | TRANSDERMAL | Qty: 1

## 2013-10-14 MED FILL — FUROSEMIDE 40 MG TAB: 40 mg | ORAL | Qty: 1

## 2013-10-14 NOTE — Behavioral Health Treatment Team (Signed)
Urine specimen taken to lab.

## 2013-10-14 NOTE — Behavioral Health Treatment Team (Signed)
Transport and BHT to escort pt to cardiology for doppler venous studies.

## 2013-10-14 NOTE — Behavioral Health Treatment Team (Cosign Needed)
GROUP THERAPY PROGRESS NOTE    The patient Troy Medina a 58 y.o. male is participating in Reflections Group    Group time: 30 minutes    Personal goal for participation: To discuss the daily goals    Goal orientation: personal    Group therapy participation: passive    Therapeutic interventions reviewed and discussed:  Yes    Impression of participation:fair    Darrel HooverJOHN E ROBINSON  10/14/2013  10:08 PM

## 2013-10-14 NOTE — Procedures (Signed)
[facility]  *** FINAL RE[facility] ***    Name: Troy DoppDILLON, Teagon  MRN: [id number]    Inpatient  DOB: 29 Dec 1955  HIS Order #: [id number]  TRAKnet Visit #: [id number]  Date: 14 Oct 2013    TYPE OF TEST: Peripheral Venous Testing    REASON FOR TEST  Pain in limb, Limb swelling    Right Leg:-  Deep venous thrombosis:           No  Superficial venous thrombosis:    No  Deep venous insufficiency:        Not examined  Superficial venous insufficiency: Not examined    Left Leg:-  Deep venous thrombosis:           No  Superficial venous thrombosis:    No  Deep venous insufficiency:        Not examined  Superficial venous insufficiency: Not examined      INTERPRETATION/FINDINGS  Right leg :  1. Deep vein(s) visualized include the common femoral, proximal  femoral, mid femoral, distal femoral, popliteal(above knee),  popliteal(fossa), popliteal(below knee), posterior tibial and peroneal   veins.  2. No evidence of deep venous thrombosis detected in the veins  visualized.  3. Superficial vein(s) visualized include the great saphenous vein.  4. No evidence of superficial thrombosis detected.  Left leg :  1. Deep vein(s) visualized include the common femoral, proximal  femoral, mid femoral, distal femoral, popliteal(above knee),  popliteal(fossa), popliteal(below knee), posterior tibial and peroneal   veins.  2. No evidence of deep venous thrombosis detected in the veins  visualized.  3. Superficial vein(s) visualized include the great saphenous vein.  4. No evidence of superficial thrombosis detected.    ADDITIONAL COMMENTS    I have personally reviewed the data relevant to the interpretation of  this  study.    TECHNOLOGIST: Virgina OrganLakeysha Harris, RVS, RCS  Signed: 10/14/2013 11:32 AM    PHYSICIAN: Sharlene DoryMohammad S. Delbert Phenixhaudhry, MD  Signed: 10/14/2013 05:36 PM

## 2013-10-14 NOTE — Progress Notes (Signed)
Recommendations: Increase kcal to 2000 kcal consistent carbohydrate diet to better meet~ needs.  I will add Glucerna as supplement.   PMH remarkable for DM (A1c 5.5 this admission), BLE orthostatic edema.  Refuses doppler.    Ht: 6'2"  Wt: 180 lb  BMI: 23.1 kg/(m^2) c/w normal weight  Est energy needs: 2050 kcal, 70 g protein, 2000 mL fluids  RD following for po intake and education needs.

## 2013-10-14 NOTE — Behavioral Health Treatment Team (Signed)
Pt rested quietly in bed with eyes closed.  No signs/symptoms of distress.  Will monitor for changes.  Q 15 minute checks continue.  2 hour chart audit done. Slept  6   hours since 2200

## 2013-10-14 NOTE — Behavioral Health Treatment Team (Cosign Needed)
GROUP THERAPY PROGRESS NOTE    Troy DoppKenneth Blankenship is participating in [place].     Group time: 30 minutes    Personal goal for participation: daily orientation    Goal orientation: personal    Group therapy participation: minimal    Therapeutic interventions reviewed and discussed: yes    Impression of participation: needed prompting

## 2013-10-14 NOTE — Behavioral Health Treatment Team (Signed)
Glucose level recheck 130.

## 2013-10-14 NOTE — Behavioral Health Treatment Team (Signed)
Psychiatric Progress Note      Date: 10/14/2013  Account Number:  [id number]  Name: Troy Medina      PSYCHOTHERAPY SESSION:  Length of psychotherapy session: 20 minutes  Main condition/diagnosis treated in session: compliace    Interpersonal relationship issues and psychodynamic conflicts explored.  Supportive psychotherapy provided in regards to various ongoing psychosocial stressors (including some of the following):   pre-admission and current problems   Housing issues   Occupational issues   Academic issues   Legal issues   Medical issues   Interpersonal conflicts   Stress of hospitalization              Worked on issues of denial & effects of substance dependency/use  Cognitive/Behavioral therapy provided  Reality-Oriented psychotherapy provided     Pt is not progressing    Extended energy and skill set needed to engage pt in psychotherapy due to the following: resistiveness of patient, complexity, resistance/negativity of patient, confrontational/hostile behaviors, and/or severe abnormalities in thought processes/psychosis resulting in the loss of expressive/receptive language communication skills.                                  E & M PROGRESS NOTE:         SUBJECTIVE:   CC: "I have problems with circular thinking"    HPI/Interval History: (reviewed/updated 10/14/2013)  The patient Troy Medina is a 58 y.o. Single, White male with a history of Schizophrenia, Paranoid type, homelessness, no medical or SA issues  who reports/evidences the following emotional symptoms:  agitation, psychotic behavior and psychosis.  The symptoms have been present for years and are of high severity. The symptoms are constant /intermittent in nature.  Additional symptoms include difficulty sleeping and increased irritability and precipitated by  and psychosocial stressors (homelesess ).  Condition made worse by  treatment noncompliance.UDS -, BAL=0.  Patient is reported to be doing better. He reports that he is thinking clearly  except some difficulty with circular thinking ? He was aware that his brother in [place] does not want him there. 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 Reports his cocaine abuse started after he was put on multiple medications for Schizophrenia, Dx in 1993. Reported that he has lost 20 + lbs in last 1 year.     Review of Systems:  (reviewed/updated 10/14/2013)  Appetite:good   Sleep: good   All other Review of Systems: - General ROS: positive for  - poor hygiene  Psychological ROS: positive for - irritability and sleep disturbances  Cardiovascular ROS: no chest pain or dyspnea on exertion    Side Effects: (reviewed/updated 10/14/2013)  None reported or admitted to.      Past Medical History: (reviewed/updated 10/14/2013)  Active Ambulatory Problems     Diagnosis Date Noted   ??? No Active Ambulatory Problems     Resolved Ambulatory Problems     Diagnosis Date Noted   ??? No Resolved Ambulatory Problems     Past Medical History   Diagnosis Date   ??? Arthritis    ??? Diabetes    ??? Psychiatric disorder      Past medical history has been reviewed (see dictated report) with no additional updates (I asked patient and no additional past medical history provided).    Social History: (reviewed/updated 10/14/2013)   reports that he has been smoking.  He does not have any  smokeless tobacco history on file. He reports that he does not drink alcohol or use illicit drugs.  History     Social History Narrative   ??? No narrative on file     Social history has been reviewed (see dictated report) with no additional updates (I asked patient and no additional social history provided).    Family History: (reviewed/updated 10/14/2013)  Family history has been reviewed (see dictated report) with no additional updates (I asked patient and no additional family history provided).    OBJECTIVE:                 MENTAL STATUS EXAM:  (reviewed/updated 10/14/2013 with changes noted below)  FINDINGS WITHIN NORMAL LIMITS (WNL)  UNLESS OTHERWISE STATED BELOW:    Orientation oriented to time, place and person   Vital Signs (BP,Pulse, Temp) See below (reviewed)   Gait and Station Within normal limits   Abnormal Muscular Movements/Tone/Behavior No EPS, no Tardive Dyskinesia, no abnormal muscular movements; wnl tone   Relations cooperative   General Appearance:  Appears older than stated age, poor hygiene    Language No aphasia or dysarthria   Speech:  normal pitch and normal volume   Thought Processes logical, wnl rate of thoughts, poor abstract reasoning and computation   Thought Associations More logical and linear.   Thought Content internally preoccupied    Suicidal Ideations none   Homicidal Ideations none   Mood:  anxious   Affect:  blunted   Memory recent  adequate   Memory remote:  adequate   Concentration/Attention:  adequate   Fund of Knowledge Fair/average   Insight:  poor   Reliability poor   Judgment:  poor     Pertinent data/vitals:  Patient Vitals for the past 24 hrs:   Temp Pulse Resp BP   10/14/13 0600 96.5 ??F (35.8 ??C) 62 18 127/83 mmHg     Admission on 10/08/2013   Component Date Value Range Status   ??? ALCOHOL(ETHYL),SERUM 10/08/2013 <10  <10 MG/DL Final   ??? Calcium, ionized (POC) 10/08/2013 1.17  1.12 - 1.32 MMOL/L Final   ??? Sodium (POC) 10/08/2013 131* 136 - 145 MMOL/L Final   ??? Potassium (POC) 10/08/2013 3.9  3.5 - 5.1 MMOL/L Final   ??? Chloride (POC) 10/08/2013 91* 98 - 107 MMOL/L Final   ??? CO2 (POC) 10/08/2013 27  21 - 32 MMOL/L Final   ??? Anion gap (POC) 10/08/2013 18* 5 - 15 mmol/L Final   ??? Glucose (POC) 10/08/2013 113* 75 - 110 MG/DL Final   ??? BUN (POC) 10/08/2013 9  9 - 20 MG/DL Final   ??? Creatinine (POC) 10/08/2013 0.6  0.6 - 1.3 MG/DL Final   ??? GFR-AA (POC) 10/08/2013 >60  >60 ml/min/1.93m Final   ??? GFR, non-AA (POC) 10/08/2013 >60  >60 ml/min/1.749mFinal   ??? Hemoglobin (POC) 10/08/2013 12.2  12.1 - 17.0 GM/DL Final   ??? Hematocrit (POC) 10/08/2013 36* 36.6 - 50.3 % Final   ??? Comment 10/08/2013 Comment Not  Indicated.   Final   ??? Glucose (POC) 10/10/2013 193* 75 - 110 mg/dL Final   ??? Performed by 10/10/2013 Mealy Monique   Final   ??? Glucose (POC) 10/10/2013 126* 75 - 110 mg/dL Final   ??? Performed by 10/10/2013 Mealy Monique   Final   ??? Glucose (POC) 10/10/2013 89  75 - 110 mg/dL Final   ??? Performed by 10/10/2013 AlJaneal Holmes Final   ??? Glucose (POC) 10/11/2013 111* 75 -  110 mg/dL Final   ??? Performed by 10/11/2013 Mealy Monique   Final   ??? Glucose (POC) 10/11/2013 151* 75 - 110 mg/dL Final   ??? Performed by 10/11/2013 [facility]   Final   ??? Glucose (POC) 10/11/2013 120* 75 - 110 mg/dL Final   ??? Performed by 10/11/2013 [facility]   Final   ??? Glucose (POC) 10/12/2013 85  75 - 110 mg/dL Final   ??? Performed by 10/12/2013 Horton Chin   Final   ??? Glucose (POC) 10/12/2013 80  75 - 110 mg/dL Final   ??? Performed by 10/12/2013 Horton Chin   Final   ??? Glucose (POC) 10/12/2013 89  75 - 110 mg/dL Final   ??? Performed by 10/12/2013 Mealy Monique   Final   ??? Glucose (POC) 10/12/2013 89  75 - 110 mg/dL Final   ??? Performed by 10/12/2013 Roebuck Corine   Final   ??? Glucose (POC) 10/13/2013 95  75 - 110 mg/dL Final   ??? Performed by 10/13/2013 Mealy Monique   Final   ??? Glucose (POC) 10/13/2013 87  75 - 110 mg/dL Final   ??? Performed by 10/13/2013 Mealy Monique   Final   ??? Glucose (POC) 10/13/2013 122* 75 - 110 mg/dL Final   ??? Performed by 10/13/2013 Dorette Grate   Final   ??? Glucose (POC) 10/13/2013 102  75 - 110 mg/dL Final   ??? Performed by 10/13/2013 Levora Dredge A.   Final   ??? Hemoglobin A1c 10/14/2013 5.5  4.2 - 6.3 % Final   ??? Est. average glucose 10/14/2013 111   Final   ??? Sodium 10/14/2013 138  136 - 145 mmol/L Final   ??? Potassium 10/14/2013 4.0  3.5 - 5.1 mmol/L Final   ??? Chloride 10/14/2013 103  97 - 108 mmol/L Final   ??? CO2 10/14/2013 29  21 - 32 mmol/L Final   ??? Anion gap 10/14/2013 6  5 - 15 mmol/L Final   ??? Glucose 10/14/2013 85  65 - 100 mg/dL Final   ??? BUN 10/14/2013 14  6 - 20 MG/DL Final    ??? Creatinine 10/14/2013 0.51  0.45 - 1.15 MG/DL Final   ??? BUN/Creatinine ratio 10/14/2013 27* 12 - 20   Final   ??? GFR est AA 10/14/2013 >60  >60 ml/min/1.61m Final   ??? GFR est non-AA 10/14/2013 >60  >60 ml/min/1.777mFinal   ??? Calcium 10/14/2013 8.0* 8.5 - 10.1 MG/DL Final   ??? Bilirubin, total 10/14/2013 0.3  0.2 - 1.0 MG/DL Final   ??? ALT 10/14/2013 40  12 - 78 U/L Final   ??? AST 10/14/2013 25  15 - 37 U/L Final   ??? Alk. phosphatase 10/14/2013 99  45 - 117 U/L Final   ??? Protein, total 10/14/2013 5.3* 6.4 - 8.2 g/dL Final   ??? Albumin 10/14/2013 2.6* 3.5 - 5.0 g/dL Final   ??? Globulin 10/14/2013 2.7  2.0 - 4.0 g/dL Final   ??? A-G Ratio 10/14/2013 1.0* 1.1 - 2.2   Final   ??? Glucose (POC) 10/14/2013 81  75 - 110 mg/dL Final   ??? Performed by 10/14/2013 HaDorette Grate Final   ??? AMPHETAMINE 10/14/2013 NEGATIVE   NEG   Final   ??? BARBITURATES 10/14/2013 NEGATIVE   NEG   Final   ??? BENZODIAZEPINE 10/14/2013 NEGATIVE   NEG   Final   ??? COCAINE 10/14/2013 NEGATIVE   NEG   Final   ??? METHADONE 10/14/2013 NEGATIVE  NEG   Final   ??? OPIATES 10/14/2013 NEGATIVE   NEG   Final   ??? PCP(PHENCYCLIDINE) 10/14/2013 NEGATIVE   NEG   Final   ??? THC (TH-CANNABINOL) 10/14/2013 NEGATIVE   NEG   Final   ??? DRUG SCRN COMMENT 10/14/2013 (NOTE)   Final         XR Results (most recent):  No results found for this or any previous visit.    Allergies:  No Known Allergies    Medications:  Current Facility-Administered Medications   Medication Dose Route Frequency   ??? fluPHENAZine (PROLIXIN) tablet 10 mg  10 mg Oral QHS   ??? furosemide (LASIX) tablet 20 mg  20 mg Oral DAILY   ??? benztropine (COGENTIN) tablet 1 mg  1 mg Oral QHS   ??? insulin lispro (HUMALOG) injection   SubCUTAneous AC&HS   ??? glucose chewable tablet 16 g  4 tablet Oral PRN   ??? dextrose (D50W) injection syrg 12.5-25 g  12.5-25 g IntraVENous PRN   ??? glucagon (GLUCAGEN) injection 1 mg  1 mg IntraMUSCular PRN   ??? ziprasidone (GEODON) 20 mg in sterile water (preservative free) injection  20 mg  IntraMUSCular BID PRN   ??? OLANZapine (ZyPREXA) tablet 5 mg  5 mg Oral Q6H PRN   ??? benztropine (COGENTIN) tablet 2 mg  2 mg Oral BID PRN   ??? benztropine (COGENTIN) injection 2 mg  2 mg IntraMUSCular Q12H PRN   ??? LORazepam (ATIVAN) injection 2 mg  2 mg IntraMUSCular Q4H PRN   ??? LORazepam (ATIVAN) tablet 1 mg  1 mg Oral Q4H PRN   ??? zolpidem (AMBIEN) tablet 10 mg  10 mg Oral QHS PRN   ??? acetaminophen (TYLENOL) tablet 650 mg  650 mg Oral Q4H PRN   ??? ibuprofen (MOTRIN) tablet 400 mg  400 mg Oral Q8H PRN   ??? magnesium hydroxide (MILK OF MAGNESIA) oral suspension 30 mL  30 mL Oral DAILY PRN   ??? nicotine (NICODERM CQ) 21 mg/24 hr patch 1 patch  1 patch TransDERmal DAILY PRN       Scheduled Medications:  Current Facility-Administered Medications   Medication Dose Route Frequency   ??? fluPHENAZine (PROLIXIN) tablet 10 mg  10 mg Oral QHS   ??? furosemide (LASIX) tablet 20 mg  20 mg Oral DAILY   ??? benztropine (COGENTIN) tablet 1 mg  1 mg Oral QHS   ??? insulin lispro (HUMALOG) injection   SubCUTAneous AC&HS         ASSESSMENT/PLAN:   Patient is still with a severe exacerbation of condition which is not improving  Diagnoses:(reviewed/updated 10/14/2013)  Patient Active Hospital Problem List:   Schizophrenia (10/08/2013)    Assessment: slow improvement. Poor insight, poor self care and responding to internal stimuli.     Plan: Continue Prolixin 10 mg at bedtime. Continue cogentin  Weight loss: hbA1c, UDS, Dietary consult.  Left leg swelling: LE Doppler          A coordinated, multidisplinary treatment team round was conducted with the patient; that includes the nurse, unit pharmcist, Catering manager all present.     The following regarding medications was addressed during rounds with patient:   the risks and benefits of the proposed medication. The patient was given the opportunity to ask questions. Informed consent given to the use of the above medications. Will continue to adjust psychiatric and non-psychiatric medications (see  above "medication" section and orders section for details) as deemed appropriate & based upon  diagnoses and response to treatment.     Will continue to order blood tests/labs and diagnostic tests as deemed appropriate and review results as they become available (see orders for details and above listed lab/test results).    Will order psychiatric records from previous psych hospitals to further elucidate the nature of patient's psychopathology and review once available.    Will gather additional collateral information from  friends, family and o/p treatment team to further elucidate the nature of patient's psychopathology and baselline level of psychiatric functioning.    Complete current electronic health record for patient has been reviewed today including consultant notes, ancillary staff notes, nurses and psychiatric tech notes    Will continue to provide individual, milieu, occupational, group, and substance abuse therapies to address target symptoms as deemed appropriate for the individual patient.      EXPECTED DISCHARGE DATE (Day): Monday    DISPOSITION: Home  Team SW to call brother in [place] to discuss disposition.    Signed By: Johnanna Schneiders, MD  10/14/2013

## 2013-10-14 NOTE — Progress Notes (Signed)
Bilateral lower extremity venous duplex completed.  Appears neg for DVT.  Full report to follow.

## 2013-10-14 NOTE — Behavioral Health Treatment Team (Cosign Needed)
GROUP THERAPY PROGRESS NOTE    The patient Troy Medina a 58 y.o. male is participating in Coping Skills Group.     Group time: 45 minutes    Personal goal for participation: To participate in chair exercise routine    Goal orientation:  personal    Group therapy participation: active    Therapeutic interventions reviewed and discussed: benefits of exercise    Impression of participation:  The patient was attentive.    BEVERLY S BAKER  10/14/2013  12:56 PM

## 2013-10-14 NOTE — Behavioral Health Treatment Team (Signed)
Pt has been visible on the unit,compliant with meals,medications and procedures.Pt denies any SI/HI or AV hallucinations.Pt has been participating in group and interacting with peers and staff.Pt has been quite cooperative and less irritable.will continue to monitor pt and behavior and anticipate pt needs.

## 2013-10-14 NOTE — Behavioral Health Treatment Team (Cosign Needed)
GROUP THERAPY PROGRESS NOTE    Troy DoppKenneth Medina is participating in [employer].     Group time: 30 minutes    Personal goal for participation: reality orientation    Goal orientation: social    Group therapy participation: minimal    Therapeutic interventions reviewed and discussed: yes    Impression of participation: no problem

## 2013-10-14 NOTE — Behavioral Health Treatment Team (Signed)
Pt glucose level 76,treated with orange juice.Pt not symptomatic.Will recheck glucose per protocol

## 2013-10-14 NOTE — Behavioral Health Treatment Team (Cosign Needed)
Pt attended reflection group.

## 2013-10-14 NOTE — Behavioral Health Treatment Team (Cosign Needed)
GROUP THERAPY PROGRESS NOTE    Troy Medina is participating in Goals Group.     Group time: 30 minutes    Personal goal for participation: to set a daily goal    Goal orientation: personal    Group therapy participation: minimal    Therapeutic interventions reviewed and discussed: yes    Impression of participation: needed prompting

## 2013-10-14 NOTE — Procedures (Signed)
[facility]  *** FINAL REPORT ***    Name: Troy Medina, Troy Medina  MRN: [id number]    Inpatient  DOB: 29 Dec 1955  HIS Order #: [id number]  TRAK[employer] Visit #: [id number]  Date: 14 Oct 2013    TYPE OF TEST: Peripheral Venous Testing    REASON FOR TEST  Pain in limb, Limb swelling    Right Leg:-  Deep venous thrombosis:           No  Superficial venous thrombosis:    No  Deep venous insufficiency:        Not examined  Superficial venous insufficiency: Not examined    Left Leg:-  Deep venous thrombosis:           No  Superficial venous thrombosis:    No  Deep venous insufficiency:        Not examined  Superficial venous insufficiency: Not examined      INTERPRETATION/FINDINGS  Right leg :  1. Deep vein(s) visualized include the common femoral, proximal  femoral, mid femoral, distal femoral, popliteal(above knee),  popliteal(fossa), popliteal(below knee), posterior tibial and peroneal   veins.  2. No evidence of deep venous thrombosis detected in the veins  visualized.  3. Superficial vein(s) visualized include the great saphenous vein.  4. No evidence of superficial thrombosis detected.  Left leg :  1. Deep vein(s) visualized include the common femoral, proximal  femoral, mid femoral, distal femoral, popliteal(above knee),  popliteal(fossa), popliteal(below knee), posterior tibial and peroneal   veins.  2. No evidence of deep venous thrombosis detected in the veins  visualized.  3. Superficial vein(s) visualized include the great saphenous vein.  4. No evidence of superficial thrombosis detected.    ADDITIONAL COMMENTS    I have personally reviewed the data relevant to the interpretation of  this  study.    TECHNOLOGIST: Virgina OrganLakeysha Harris, RVS, RCS  Signed: 10/14/2013 11:32 AM    PHYSICIAN: Sharlene DoryMohammad S. Delbert Phenixhaudhry, MD  Signed: 10/14/2013 05:36 PM

## 2013-10-15 LAB — GLUCOSE, POC
Glucose (POC): 103 mg/dL (ref 75–110)
Glucose (POC): 100 mg/dL (ref 75–110)
Glucose (POC): 89 mg/dL (ref 75–110)
Glucose (POC): 105 mg/dL (ref 75–110)

## 2013-10-15 MED FILL — NICOTINE 21 MG/24 HR DAILY PATCH: 21 mg/24 hr | TRANSDERMAL | Qty: 1

## 2013-10-15 MED FILL — FLUPHENAZINE 5 MG TAB: 5 mg | ORAL | Qty: 2

## 2013-10-15 MED FILL — ZOLPIDEM 5 MG TAB: 5 mg | ORAL | Qty: 2

## 2013-10-15 MED FILL — BENZTROPINE 1 MG TAB: 1 mg | ORAL | Qty: 1

## 2013-10-15 MED FILL — FUROSEMIDE 40 MG TAB: 40 mg | ORAL | Qty: 1

## 2013-10-15 NOTE — Behavioral Health Treatment Team (Signed)
Patient is alert and oriented. Patient has been visible on the unit and his behaviors are appropriate. Patient's affect is bright and his mood is pleasant and cooperative. Patient has displayed positive interactions with peers and staff. Patient has been participating in groups and activities. Patient has been meal and medication compliant.Patient has been maintaining acceptable hygiene but his appearance is disheveled. Patient has no concerns or complaints. Patient has openly talked about his occupation and his mental illness. Patient denies suicidal/homicidal ideation and auditory/visual hallucinations. Continue to monitor patient for safety and needs Q 15 minutes.

## 2013-10-15 NOTE — Behavioral Health Treatment Team (Signed)
Psychiatric Progress Note      Date: 10/15/2013  Account Number:  [id number]  Name: Troy Medina      PSYCHOTHERAPY SESSION:  Length of psychotherapy session: 20 minutes  Main condition/diagnosis treated in session: compliace    Interpersonal relationship issues and psychodynamic conflicts explored.  Supportive psychotherapy provided in regards to various ongoing psychosocial stressors (including some of the following):   pre-admission and current problems   Housing issues   Occupational issues   Academic issues   Legal issues   Medical issues   Interpersonal conflicts   Stress of hospitalization              Worked on issues of denial & effects of substance dependency/use  Cognitive/Behavioral therapy provided  Reality-Oriented psychotherapy provided     Pt is not progressing    Extended energy and skill set needed to engage pt in psychotherapy due to the following: resistiveness of patient, complexity, resistance/negativity of patient, confrontational/hostile behaviors, and/or severe abnormalities in thought processes/psychosis resulting in the loss of expressive/receptive language communication skills.                                  E & M PROGRESS NOTE:         SUBJECTIVE:   CC: "I have problems with circular thinking"    HPI/Interval History: (reviewed/updated 10/15/2013)  The patient Troy Medina is a 58 y.o. Single, White male with a history of Schizophrenia, Paranoid type, homelessness, no medical or SA issues  who reports/evidences the following emotional symptoms:  agitation, psychotic behavior and psychosis.  The symptoms have been present for years and are of high severity. The symptoms are constant /intermittent in nature.  Additional symptoms include difficulty sleeping and increased irritability and precipitated by  and psychosocial stressors (homelesess ).  Condition made worse by  treatment noncompliance.UDS -, BAL=0.  Patient is reported to be doing better. He reports that he is thinking clearly  except some difficulty with circular thinking ? He was aware that his brother in [place] does not want him there. 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 Reports his cocaine abuse started after he was put on multiple medications for Schizophrenia, Dx in 1993. Reported that he has lost 20 + lbs in last 1 year.   Patient's work up for weight loss is negative. He is seen by Dietary and his meals are being adjusted for low albumin and weight loss. Agrees with d/c plan for Monday.    Review of Systems:  (reviewed/updated 10/15/2013)  Appetite:good   Sleep: good   All other Review of Systems: - General ROS: positive for  - poor hygiene  Psychological ROS: positive for - irritability and sleep disturbances  Cardiovascular ROS: no chest pain or dyspnea on exertion    Side Effects: (reviewed/updated 10/15/2013)  None reported or admitted to.      Past Medical History: (reviewed/updated 10/15/2013)  Active Ambulatory Problems     Diagnosis Date Noted   ??? No Active Ambulatory Problems     Resolved Ambulatory Problems     Diagnosis Date Noted   ??? No Resolved Ambulatory Problems     Past Medical History   Diagnosis Date   ??? Arthritis    ??? Diabetes    ??? Psychiatric disorder      Past medical history has been reviewed (see dictated report) with no additional  updates (I asked patient and no additional past medical history provided).    Social History: (reviewed/updated 10/15/2013)   reports that he has been smoking.  He does not have any smokeless tobacco history on file. He reports that he does not drink alcohol or use illicit drugs.  History     Social History Narrative   ??? No narrative on file     Social history has been reviewed (see dictated report) with no additional updates (I asked patient and no additional social history provided).    Family History: (reviewed/updated 10/15/2013)  Family history has been reviewed (see dictated report) with no additional updates (I asked patient and no additional  family history provided).    OBJECTIVE:                 MENTAL STATUS EXAM:  (reviewed/updated 10/15/2013 with changes noted below)  FINDINGS WITHIN NORMAL LIMITS (WNL) UNLESS OTHERWISE STATED BELOW:    Orientation oriented to time, place and person   Vital Signs (BP,Pulse, Temp) See below (reviewed)   Gait and Station Within normal limits   Abnormal Muscular Movements/Tone/Behavior No EPS, no Tardive Dyskinesia, no abnormal muscular movements; wnl tone   Relations cooperative   General Appearance:  Appears older than stated age, poor hygiene    Language No aphasia or dysarthria   Speech:  normal pitch and normal volume   Thought Processes logical, wnl rate of thoughts, poor abstract reasoning and computation   Thought Associations More logical and linear.   Thought Content No gross delusions or hallucinations   Suicidal Ideations none   Homicidal Ideations none   Mood:  anxious   Affect:  blunted   Memory recent  adequate   Memory remote:  adequate   Concentration/Attention:  adequate   Fund of Knowledge Fair/average   Insight:  poor   Reliability poor   Judgment:  poor     Pertinent data/vitals:  Patient Vitals for the past 24 hrs:   Temp Pulse Resp BP   10/15/13 0724 98.4 ??F (36.9 ??C) 74 16 116/70 mmHg     Admission on 10/08/2013   Component Date Value Ref Range Status   ??? ALCOHOL(ETHYL),SERUM 10/08/2013 <10  <10 MG/DL Final   ??? Calcium, ionized (POC) 10/08/2013 1.17  1.12 - 1.32 MMOL/L Final   ??? Sodium (POC) 10/08/2013 131* 136 - 145 MMOL/L Final   ??? Potassium (POC) 10/08/2013 3.9  3.5 - 5.1 MMOL/L Final   ??? Chloride (POC) 10/08/2013 91* 98 - 107 MMOL/L Final   ??? CO2 (POC) 10/08/2013 27  21 - 32 MMOL/L Final   ??? Anion gap (POC) 10/08/2013 18* 5 - 15 mmol/L Final   ??? Glucose (POC) 10/08/2013 113* 75 - 110 MG/DL Final   ??? BUN (POC) 10/08/2013 9  9 - 20 MG/DL Final   ??? Creatinine (POC) 10/08/2013 0.6  0.6 - 1.3 MG/DL Final   ??? GFR-AA (POC) 10/08/2013 >60  >60 ml/min/1.7m Final   ??? GFR, non-AA (POC) 10/08/2013 >60   >60 ml/min/1.761mFinal   ??? Hemoglobin (POC) 10/08/2013 12.2  12.1 - 17.0 GM/DL Final   ??? Hematocrit (POC) 10/08/2013 36* 36.6 - 50.3 % Final   ??? Comment 10/08/2013 Comment Not Indicated.   Final   ??? Glucose (POC) 10/10/2013 193* 75 - 110 mg/dL Final   ??? Performed by 10/10/2013 Mealy Monique   Final   ??? Glucose (POC) 10/10/2013 126* 75 - 110 mg/dL Final   ??? Performed by 10/10/2013 MeAlfonso Ellis  Final   ??? Glucose (POC) 10/10/2013 89  75 - 110 mg/dL Final   ??? Performed by 10/10/2013 Janeal Holmes   Final   ??? Glucose (POC) 10/11/2013 111* 75 - 110 mg/dL Final   ??? Performed by 10/11/2013 Mealy Monique   Final   ??? Glucose (POC) 10/11/2013 151* 75 - 110 mg/dL Final   ??? Performed by 10/11/2013 [facility] Tajudeen   Final   ??? Glucose (POC) 10/11/2013 120* 75 - 110 mg/dL Final   ??? Performed by 10/11/2013 [facility] Tajudeen   Final   ??? Glucose (POC) 10/12/2013 85  75 - 110 mg/dL Final   ??? Performed by 10/12/2013 Horton Chin   Final   ??? Glucose (POC) 10/12/2013 80  75 - 110 mg/dL Final   ??? Performed by 10/12/2013 Horton Chin   Final   ??? Glucose (POC) 10/12/2013 89  75 - 110 mg/dL Final   ??? Performed by 10/12/2013 Mealy Monique   Final   ??? Glucose (POC) 10/12/2013 89  75 - 110 mg/dL Final   ??? Performed by 10/12/2013 Roebuck Corine   Final   ??? Glucose (POC) 10/13/2013 95  75 - 110 mg/dL Final   ??? Performed by 10/13/2013 Mealy Monique   Final   ??? Glucose (POC) 10/13/2013 87  75 - 110 mg/dL Final   ??? Performed by 10/13/2013 Mealy Monique   Final   ??? Glucose (POC) 10/13/2013 122* 75 - 110 mg/dL Final   ??? Performed by 10/13/2013 Dorette Grate   Final   ??? Glucose (POC) 10/13/2013 102  75 - 110 mg/dL Final   ??? Performed by 10/13/2013 Levora Dredge A.   Final   ??? Hemoglobin A1c 10/14/2013 5.5  4.2 - 6.3 % Final   ??? Est. average glucose 10/14/2013 111   Final   ??? Sodium 10/14/2013 138  136 - 145 mmol/L Final   ??? Potassium 10/14/2013 4.0  3.5 - 5.1 mmol/L Final   ??? Chloride 10/14/2013 103  97 - 108 mmol/L Final   ???  CO2 10/14/2013 29  21 - 32 mmol/L Final   ??? Anion gap 10/14/2013 6  5 - 15 mmol/L Final   ??? Glucose 10/14/2013 85  65 - 100 mg/dL Final   ??? BUN 10/14/2013 14  6 - 20 MG/DL Final   ??? Creatinine 10/14/2013 0.51  0.45 - 1.15 MG/DL Final   ??? BUN/Creatinine ratio 10/14/2013 27* 12 - 20   Final   ??? GFR est AA 10/14/2013 >60  >60 ml/min/1.44m Final   ??? GFR est non-AA 10/14/2013 >60  >60 ml/min/1.727mFinal   ??? Calcium 10/14/2013 8.0* 8.5 - 10.1 MG/DL Final   ??? Bilirubin, total 10/14/2013 0.3  0.2 - 1.0 MG/DL Final   ??? ALT 10/14/2013 40  12 - 78 U/L Final   ??? AST 10/14/2013 25  15 - 37 U/L Final   ??? Alk. phosphatase 10/14/2013 99  45 - 117 U/L Final   ??? Protein, total 10/14/2013 5.3* 6.4 - 8.2 g/dL Final   ??? Albumin 10/14/2013 2.6* 3.5 - 5.0 g/dL Final   ??? Globulin 10/14/2013 2.7  2.0 - 4.0 g/dL Final   ??? A-G Ratio 10/14/2013 1.0* 1.1 - 2.2   Final   ??? Glucose (POC) 10/14/2013 81  75 - 110 mg/dL Final   ??? Performed by 10/14/2013 HaDorette Grate Final   ??? AMPHETAMINE 10/14/2013 NEGATIVE   NEG   Final   ??? BARBITURATES 10/14/2013 NEGATIVE  NEG   Final   ??? BENZODIAZEPINE 10/14/2013 NEGATIVE   NEG   Final   ??? COCAINE 10/14/2013 NEGATIVE   NEG   Final   ??? METHADONE 10/14/2013 NEGATIVE   NEG   Final   ??? OPIATES 10/14/2013 NEGATIVE   NEG   Final   ??? PCP(PHENCYCLIDINE) 10/14/2013 NEGATIVE   NEG   Final   ??? THC (TH-CANNABINOL) 10/14/2013 NEGATIVE   NEG   Final   ??? DRUG SCRN COMMENT 10/14/2013 (NOTE)   Final   ??? Glucose (POC) 10/14/2013 76  75 - 110 mg/dL Final   ??? Performed by 10/14/2013 Dorette Grate   Final   ??? Glucose (POC) 10/14/2013 130* 75 - 110 mg/dL Final   ??? Performed by 10/14/2013 Dorette Grate   Final   ??? Glucose (POC) 10/14/2013 108  75 - 110 mg/dL Final   ??? Performed by 10/14/2013 Wynetta Emery Essence   Final   ??? Glucose (POC) 10/14/2013 103  75 - 110 mg/dL Final   ??? Performed by 10/14/2013 Wynetta Emery Essence   Final   ??? Glucose (POC) 10/15/2013 100  75 - 110 mg/dL Final   ??? Performed by 10/15/2013 Mealy  Monique   Final         XR Results (most recent):  No results found for this or any previous visit.    Allergies:  No Known Allergies    Medications:  Current Facility-Administered Medications   Medication Dose Route Frequency   ??? fluPHENAZine (PROLIXIN) tablet 10 mg  10 mg Oral QHS   ??? furosemide (LASIX) tablet 20 mg  20 mg Oral DAILY   ??? benztropine (COGENTIN) tablet 1 mg  1 mg Oral QHS   ??? insulin lispro (HUMALOG) injection   SubCUTAneous AC&HS   ??? glucose chewable tablet 16 g  4 Tab Oral PRN   ??? dextrose (D50W) injection syrg 12.5-25 g  12.5-25 g IntraVENous PRN   ??? glucagon (GLUCAGEN) injection 1 mg  1 mg IntraMUSCular PRN   ??? ziprasidone (GEODON) 20 mg in sterile water (preservative free) injection  20 mg IntraMUSCular BID PRN   ??? OLANZapine (ZyPREXA) tablet 5 mg  5 mg Oral Q6H PRN   ??? benztropine (COGENTIN) tablet 2 mg  2 mg Oral BID PRN   ??? benztropine (COGENTIN) injection 2 mg  2 mg IntraMUSCular Q12H PRN   ??? LORazepam (ATIVAN) injection 2 mg  2 mg IntraMUSCular Q4H PRN   ??? LORazepam (ATIVAN) tablet 1 mg  1 mg Oral Q4H PRN   ??? zolpidem (AMBIEN) tablet 10 mg  10 mg Oral QHS PRN   ??? acetaminophen (TYLENOL) tablet 650 mg  650 mg Oral Q4H PRN   ??? ibuprofen (MOTRIN) tablet 400 mg  400 mg Oral Q8H PRN   ??? magnesium hydroxide (MILK OF MAGNESIA) oral suspension 30 mL  30 mL Oral DAILY PRN   ??? nicotine (NICODERM CQ) 21 mg/24 hr patch 1 patch  1 Patch TransDERmal DAILY PRN       Scheduled Medications:  Current Facility-Administered Medications   Medication Dose Route Frequency   ??? fluPHENAZine (PROLIXIN) tablet 10 mg  10 mg Oral QHS   ??? furosemide (LASIX) tablet 20 mg  20 mg Oral DAILY   ??? benztropine (COGENTIN) tablet 1 mg  1 mg Oral QHS   ??? insulin lispro (HUMALOG) injection   SubCUTAneous AC&HS         ASSESSMENT/PLAN:   Patient is still with a severe exacerbation of condition which  is not improving  Diagnoses:(reviewed/updated 10/15/2013)  Patient Active Hospital Problem List:   Schizophrenia (10/08/2013)     Assessment: Much improved. Compliant. Improved insight and judgement.     Plan: Continue Prolixin 10 mg at bedtime. Continue cogentin  Weight loss: HbA1c: 5.5 UDS - negative,   Dietary consult.- done  Left leg swelling: LE Doppler - negative. No DVT          A coordinated, multidisplinary treatment team round was conducted with the patient; that includes the nurse, unit pharmcist, Catering manager all present.     The following regarding medications was addressed during rounds with patient:   the risks and benefits of the proposed medication. The patient was given the opportunity to ask questions. Informed consent given to the use of the above medications. Will continue to adjust psychiatric and non-psychiatric medications (see above "medication" section and orders section for details) as deemed appropriate & based upon diagnoses and response to treatment.     Will continue to order blood tests/labs and diagnostic tests as deemed appropriate and review results as they become available (see orders for details and above listed lab/test results).    Will order psychiatric records from previous psych hospitals to further elucidate the nature of patient's psychopathology and review once available.    Will gather additional collateral information from  friends, family and o/p treatment team to further elucidate the nature of patient's psychopathology and baselline level of psychiatric functioning.    Complete current electronic health record for patient has been reviewed today including consultant notes, ancillary staff notes, nurses and psychiatric tech notes    Will continue to provide individual, milieu, occupational, group, and substance abuse therapies to address target symptoms as deemed appropriate for the individual patient.      EXPECTED DISCHARGE DATE (Day): Monday to shelter    DISPOSITION: Local shelter  Psych FU with [facility] recommended.      Signed By: Johnanna Schneiders, MD  10/15/2013

## 2013-10-15 NOTE — Behavioral Health Treatment Team (Cosign Needed)
GROUP THERAPY PROGRESS NOTE    The patient Troy Medina a 58 y.o. male is participating in Coping Skills Group.     Group time: 45 minutes    Personal goal for participation: To participate in coping skills game    Goal orientation:  personal    Group therapy participation: active    Therapeutic interventions reviewed and discussed: positive coping skills board game    Impression of participation:  The patient was attentive.    Troy Medina  10/15/2013  1:08 PM

## 2013-10-15 NOTE — Behavioral Health Treatment Team (Signed)
Pt rested quietly in bed with eyes closed, .  No signs/symptoms of distress.  Will monitor for changes.  Q 15 minute checks continue.  2 hour chart audit done. Slept   5  hours since 2200 after prn [place]

## 2013-10-15 NOTE — Progress Notes (Signed)
Problem: Altered Thought Process (Adult/Pediatric)  Goal: *STG: Participates in treatment plan  Outcome: Progressing Towards Goal  Pt participated in treatment team.Pt cooperative and very pleasant.Pt denies any SI/HI or AV hallucinations.Anticipated discharge on Monday 10/18/13.SW to find placement in a local shelter and distribute resource information (i:e [employer] etc.Will continue to monitor pt, behavior and anticipate needs.

## 2013-10-15 NOTE — Behavioral Health Treatment Team (Cosign Needed)
GROUP THERAPY PROGRESS NOTE    The patient Troy Medina a 58 y.o. male is participating in Creative Expression Group.     Group time: 1 hour    Personal goal for participation: To concentrate on selected task    Goal orientation: social    Group therapy participation: active    Therapeutic interventions reviewed and discussed: Crafts, games, music    Impression of participation: The patient was attentive.    Audelia HivesBeverly S Baker  10/15/2013  4:27 PM

## 2013-10-15 NOTE — Behavioral Health Treatment Team (Signed)
Pt attended reflection group.

## 2013-10-15 NOTE — Behavioral Health Treatment Team (Signed)
GROUP THERAPY PROGRESS NOTE    Troy DoppKenneth Medina is participating in [name].     Group time: 30 minutes    Personal goal for participation: reality orientation    Goal orientation: social    Group therapy participation: active    Therapeutic interventions reviewed and discussed: yes    Impression of participation: good

## 2013-10-16 LAB — GLUCOSE, POC
Glucose (POC): 113 mg/dL — ABNORMAL HIGH (ref 75–110)
Glucose (POC): 87 mg/dL (ref 75–110)
Glucose (POC): 84 mg/dL (ref 75–110)
Glucose (POC): 94 mg/dL (ref 75–110)

## 2013-10-16 MED FILL — FLUPHENAZINE 5 MG TAB: 5 mg | ORAL | Qty: 2

## 2013-10-16 MED FILL — FUROSEMIDE 40 MG TAB: 40 mg | ORAL | Qty: 1

## 2013-10-16 MED FILL — BENZTROPINE 1 MG TAB: 1 mg | ORAL | Qty: 1

## 2013-10-16 MED FILL — ZOLPIDEM 5 MG TAB: 5 mg | ORAL | Qty: 2

## 2013-10-16 MED FILL — NICOTINE 21 MG/24 HR DAILY PATCH: 21 mg/24 hr | TRANSDERMAL | Qty: 1

## 2013-10-16 NOTE — Behavioral Health Treatment Team (Signed)
Patient requested medication for sleep and medicated with Ambien 10 mg as ordered.

## 2013-10-16 NOTE — Behavioral Health Treatment Team (Signed)
Psychiatric Progress Note      Date: 10/16/2013  Account Number:  [id number]  Name: Troy Medina      PSYCHOTHERAPY SESSION:  Length of psychotherapy session: 20 minutes  Main condition/diagnosis treated in session: compliace    Interpersonal relationship issues and psychodynamic conflicts explored.  Supportive psychotherapy provided in regards to various ongoing psychosocial stressors (including some of the following):   pre-admission and current problems   Housing issues   Occupational issues   Academic issues   Legal issues   Medical issues   Interpersonal conflicts   Stress of hospitalization              Worked on issues of denial & effects of substance dependency/use  Cognitive/Behavioral therapy provided  Reality-Oriented psychotherapy provided     Pt is not progressing    Extended energy and skill set needed to engage pt in psychotherapy due to the following: resistiveness of patient, complexity, resistance/negativity of patient, confrontational/hostile behaviors, and/or severe abnormalities in thought processes/psychosis resulting in the loss of expressive/receptive language communication skills.                                  E & M PROGRESS NOTE:         SUBJECTIVE:   CC: "I have problems with circular thinking"    HPI/Interval History: (reviewed/updated 10/16/2013)  The patient Troy Medina is a 58 y.o. Single, White male with a history of Schizophrenia, Paranoid type, homelessness, no medical or SA issues  who reports/evidences the following emotional symptoms:  agitation, psychotic behavior and psychosis.  The symptoms have been present for years and are of high severity. The symptoms are constant /intermittent in nature.  Additional symptoms include difficulty sleeping and increased irritability and precipitated by  and psychosocial stressors (homelesess ).  Condition made worse by  treatment noncompliance.UDS -, BAL=0.  Patient is reported to be doing better. He reports that he is thinking clearly  except some difficulty with circular thinking ? He was aware that his brother in [place] does not want him there. 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 Reports his cocaine abuse started after he was put on multiple medications for Schizophrenia, Dx in 1993. Reported that he has lost 20 + lbs in last 1 year.   Patient's work up for weight loss is negative. He is seen by Dietary and his meals are being adjusted for low albumin and weight loss. Agrees with d/c plan for Monday.    No complaints today, pt continues on track for discharge on Monday.    Review of Systems:  (reviewed/updated 10/16/2013)  Appetite:good   Sleep: good   All other Review of Systems: - General ROS: positive for  - poor hygiene  Psychological ROS: positive for - irritability and sleep disturbances  Cardiovascular ROS: no chest pain or dyspnea on exertion    Side Effects: (reviewed/updated 10/16/2013)  None reported or admitted to.      Past Medical History: (reviewed/updated 10/16/2013)  Active Ambulatory Problems     Diagnosis Date Noted   ??? No Active Ambulatory Problems     Resolved Ambulatory Problems     Diagnosis Date Noted   ??? No Resolved Ambulatory Problems     Past Medical History   Diagnosis Date   ??? Arthritis    ??? Diabetes    ??? Psychiatric disorder  Past medical history has been reviewed (see dictated report) with no additional updates (I asked patient and no additional past medical history provided).    Social History: (reviewed/updated 10/16/2013)   reports that he has been smoking.  He does not have any smokeless tobacco history on file. He reports that he does not drink alcohol or use illicit drugs.  History     Social History Narrative   ??? No narrative on file     Social history has been reviewed (see dictated report) with no additional updates (I asked patient and no additional social history provided).    Family History: (reviewed/updated 10/16/2013)  Family history has been reviewed (see dictated  report) with no additional updates (I asked patient and no additional family history provided).    OBJECTIVE:                 MENTAL STATUS EXAM:  (reviewed/updated 10/16/2013 with changes noted below)  FINDINGS WITHIN NORMAL LIMITS (WNL) UNLESS OTHERWISE STATED BELOW:    Orientation oriented to time, place and person   Vital Signs (BP,Pulse, Temp) See below (reviewed)   Gait and Station Within normal limits   Abnormal Muscular Movements/Tone/Behavior No EPS, no Tardive Dyskinesia, no abnormal muscular movements; wnl tone   Relations cooperative   General Appearance:  Appears older than stated age, poor hygiene    Language No aphasia or dysarthria   Speech:  normal pitch and normal volume   Thought Processes logical, wnl rate of thoughts, poor abstract reasoning and computation   Thought Associations More logical and linear.   Thought Content No gross delusions or hallucinations   Suicidal Ideations none   Homicidal Ideations none   Mood:  anxious   Affect:  blunted   Memory recent  adequate   Memory remote:  adequate   Concentration/Attention:  adequate   Fund of Knowledge Fair/average   Insight:  poor   Reliability poor   Judgment:  poor     Pertinent data/vitals:  Patient Vitals for the past 24 hrs:   Temp Pulse Resp BP   10/16/13 0702 98.4 ??F (36.9 ??C) 58 18 119/89 mmHg     Admission on 10/08/2013   Component Date Value Ref Range Status   ??? ALCOHOL(ETHYL),SERUM 10/08/2013 <10  <10 MG/DL Final   ??? Calcium, ionized (POC) 10/08/2013 1.17  1.12 - 1.32 MMOL/L Final   ??? Sodium (POC) 10/08/2013 131* 136 - 145 MMOL/L Final   ??? Potassium (POC) 10/08/2013 3.9  3.5 - 5.1 MMOL/L Final   ??? Chloride (POC) 10/08/2013 91* 98 - 107 MMOL/L Final   ??? CO2 (POC) 10/08/2013 27  21 - 32 MMOL/L Final   ??? Anion gap (POC) 10/08/2013 18* 5 - 15 mmol/L Final   ??? Glucose (POC) 10/08/2013 113* 75 - 110 MG/DL Final   ??? BUN (POC) 10/08/2013 9  9 - 20 MG/DL Final   ??? Creatinine (POC) 10/08/2013 0.6  0.6 - 1.3 MG/DL Final   ??? GFR-AA (POC)  10/08/2013 >60  >60 ml/min/1.37m Final   ??? GFR, non-AA (POC) 10/08/2013 >60  >60 ml/min/1.784mFinal   ??? Hemoglobin (POC) 10/08/2013 12.2  12.1 - 17.0 GM/DL Final   ??? Hematocrit (POC) 10/08/2013 36* 36.6 - 50.3 % Final   ??? Comment 10/08/2013 Comment Not Indicated.   Final   ??? Glucose (POC) 10/10/2013 193* 75 - 110 mg/dL Final   ??? Performed by 10/10/2013 Mealy Monique   Final   ??? Glucose (POC) 10/10/2013 126* 75 -  110 mg/dL Final   ??? Performed by 10/10/2013 Mealy Monique   Final   ??? Glucose (POC) 10/10/2013 89  75 - 110 mg/dL Final   ??? Performed by 10/10/2013 Janeal Holmes   Final   ??? Glucose (POC) 10/11/2013 111* 75 - 110 mg/dL Final   ??? Performed by 10/11/2013 Mealy Monique   Final   ??? Glucose (POC) 10/11/2013 151* 75 - 110 mg/dL Final   ??? Performed by 10/11/2013 [facility] Tajudeen   Final   ??? Glucose (POC) 10/11/2013 120* 75 - 110 mg/dL Final   ??? Performed by 10/11/2013 [facility] Tajudeen   Final   ??? Glucose (POC) 10/12/2013 85  75 - 110 mg/dL Final   ??? Performed by 10/12/2013 Horton Chin   Final   ??? Glucose (POC) 10/12/2013 80  75 - 110 mg/dL Final   ??? Performed by 10/12/2013 Horton Chin   Final   ??? Glucose (POC) 10/12/2013 89  75 - 110 mg/dL Final   ??? Performed by 10/12/2013 Mealy Monique   Final   ??? Glucose (POC) 10/12/2013 89  75 - 110 mg/dL Final   ??? Performed by 10/12/2013 Roebuck Corine   Final   ??? Glucose (POC) 10/13/2013 95  75 - 110 mg/dL Final   ??? Performed by 10/13/2013 Mealy Monique   Final   ??? Glucose (POC) 10/13/2013 87  75 - 110 mg/dL Final   ??? Performed by 10/13/2013 Mealy Monique   Final   ??? Glucose (POC) 10/13/2013 122* 75 - 110 mg/dL Final   ??? Performed by 10/13/2013 Dorette Grate   Final   ??? Glucose (POC) 10/13/2013 102  75 - 110 mg/dL Final   ??? Performed by 10/13/2013 Levora Dredge A.   Final   ??? Hemoglobin A1c 10/14/2013 5.5  4.2 - 6.3 % Final   ??? Est. average glucose 10/14/2013 111   Final   ??? Sodium 10/14/2013 138  136 - 145 mmol/L Final   ??? Potassium 10/14/2013 4.0   3.5 - 5.1 mmol/L Final   ??? Chloride 10/14/2013 103  97 - 108 mmol/L Final   ??? CO2 10/14/2013 29  21 - 32 mmol/L Final   ??? Anion gap 10/14/2013 6  5 - 15 mmol/L Final   ??? Glucose 10/14/2013 85  65 - 100 mg/dL Final   ??? BUN 10/14/2013 14  6 - 20 MG/DL Final   ??? Creatinine 10/14/2013 0.51  0.45 - 1.15 MG/DL Final   ??? BUN/Creatinine ratio 10/14/2013 27* 12 - 20   Final   ??? GFR est AA 10/14/2013 >60  >60 ml/min/1.79m Final   ??? GFR est non-AA 10/14/2013 >60  >60 ml/min/1.770mFinal   ??? Calcium 10/14/2013 8.0* 8.5 - 10.1 MG/DL Final   ??? Bilirubin, total 10/14/2013 0.3  0.2 - 1.0 MG/DL Final   ??? ALT 10/14/2013 40  12 - 78 U/L Final   ??? AST 10/14/2013 25  15 - 37 U/L Final   ??? Alk. phosphatase 10/14/2013 99  45 - 117 U/L Final   ??? Protein, total 10/14/2013 5.3* 6.4 - 8.2 g/dL Final   ??? Albumin 10/14/2013 2.6* 3.5 - 5.0 g/dL Final   ??? Globulin 10/14/2013 2.7  2.0 - 4.0 g/dL Final   ??? A-G Ratio 10/14/2013 1.0* 1.1 - 2.2   Final   ??? Glucose (POC) 10/14/2013 81  75 - 110 mg/dL Final   ??? Performed by 10/14/2013 HaDorette Grate Final   ??? AMPHETAMINE 10/14/2013 NEGATIVE  NEG   Final   ??? BARBITURATES 10/14/2013 NEGATIVE   NEG   Final   ??? BENZODIAZEPINE 10/14/2013 NEGATIVE   NEG   Final   ??? COCAINE 10/14/2013 NEGATIVE   NEG   Final   ??? METHADONE 10/14/2013 NEGATIVE   NEG   Final   ??? OPIATES 10/14/2013 NEGATIVE   NEG   Final   ??? PCP(PHENCYCLIDINE) 10/14/2013 NEGATIVE   NEG   Final   ??? THC (TH-CANNABINOL) 10/14/2013 NEGATIVE   NEG   Final   ??? DRUG SCRN COMMENT 10/14/2013 (NOTE)   Final   ??? Glucose (POC) 10/14/2013 76  75 - 110 mg/dL Final   ??? Performed by 10/14/2013 Dorette Grate   Final   ??? Glucose (POC) 10/14/2013 130* 75 - 110 mg/dL Final   ??? Performed by 10/14/2013 Dorette Grate   Final   ??? Glucose (POC) 10/14/2013 108  75 - 110 mg/dL Final   ??? Performed by 10/14/2013 Wynetta Emery Essence   Final   ??? Glucose (POC) 10/14/2013 103  75 - 110 mg/dL Final   ??? Performed by 10/14/2013 Wynetta Emery Essence   Final   ??? Glucose  (POC) 10/15/2013 100  75 - 110 mg/dL Final   ??? Performed by 10/15/2013 Mealy Monique   Final   ??? Glucose (POC) 10/15/2013 89  75 - 110 mg/dL Final   ??? Performed by 10/15/2013 Mealy Monique   Final   ??? Glucose (POC) 10/15/2013 105  75 - 110 mg/dL Final   ??? Performed by 10/15/2013 Denyse Dago   Final   ??? Glucose (POC) 10/15/2013 113* 75 - 110 mg/dL Final   ??? Performed by 10/15/2013 Denyse Dago   Final   ??? Glucose (POC) 10/16/2013 87  75 - 110 mg/dL Final   ??? Performed by 10/16/2013 Gwenlyn Found   Final   ??? Glucose (POC) 10/16/2013 84  75 - 110 mg/dL Final   ??? Performed by 10/16/2013 Gwenlyn Found   Final         XR Results (most recent):  No results found for this or any previous visit.    Allergies:  No Known Allergies    Medications:  Current Facility-Administered Medications   Medication Dose Route Frequency   ??? fluPHENAZine (PROLIXIN) tablet 10 mg  10 mg Oral QHS   ??? furosemide (LASIX) tablet 20 mg  20 mg Oral DAILY   ??? benztropine (COGENTIN) tablet 1 mg  1 mg Oral QHS   ??? insulin lispro (HUMALOG) injection   SubCUTAneous AC&HS   ??? glucose chewable tablet 16 g  4 Tab Oral PRN   ??? dextrose (D50W) injection syrg 12.5-25 g  12.5-25 g IntraVENous PRN   ??? glucagon (GLUCAGEN) injection 1 mg  1 mg IntraMUSCular PRN   ??? ziprasidone (GEODON) 20 mg in sterile water (preservative free) injection  20 mg IntraMUSCular BID PRN   ??? OLANZapine (ZyPREXA) tablet 5 mg  5 mg Oral Q6H PRN   ??? benztropine (COGENTIN) tablet 2 mg  2 mg Oral BID PRN   ??? benztropine (COGENTIN) injection 2 mg  2 mg IntraMUSCular Q12H PRN   ??? LORazepam (ATIVAN) injection 2 mg  2 mg IntraMUSCular Q4H PRN   ??? LORazepam (ATIVAN) tablet 1 mg  1 mg Oral Q4H PRN   ??? zolpidem (AMBIEN) tablet 10 mg  10 mg Oral QHS PRN   ??? acetaminophen (TYLENOL) tablet 650 mg  650 mg Oral Q4H PRN   ??? ibuprofen (MOTRIN) tablet  400 mg  400 mg Oral Q8H PRN   ??? magnesium hydroxide (MILK OF MAGNESIA) oral suspension 30 mL  30 mL Oral DAILY PRN   ??? nicotine  (NICODERM CQ) 21 mg/24 hr patch 1 patch  1 Patch TransDERmal DAILY PRN       Scheduled Medications:  Current Facility-Administered Medications   Medication Dose Route Frequency   ??? fluPHENAZine (PROLIXIN) tablet 10 mg  10 mg Oral QHS   ??? furosemide (LASIX) tablet 20 mg  20 mg Oral DAILY   ??? benztropine (COGENTIN) tablet 1 mg  1 mg Oral QHS   ??? insulin lispro (HUMALOG) injection   SubCUTAneous AC&HS         ASSESSMENT/PLAN:   Patient is still with a severe exacerbation of condition which is not improving  Diagnoses:(reviewed/updated 10/16/2013)  Patient Active Hospital Problem List:   Schizophrenia (10/08/2013)    Assessment: Much improved. Compliant. Improved insight and judgement.     Plan: Continue Prolixin 10 mg at bedtime. Continue cogentin  Weight loss: HbA1c: 5.5 UDS - negative,   Dietary consult.- done  Left leg swelling: LE Doppler - negative. No DVT    Current plan to discharge on Monday.          A coordinated, multidisplinary treatment team round was conducted with the patient; that includes the nurse, unit pharmcist, Catering manager all present.     The following regarding medications was addressed during rounds with patient:   the risks and benefits of the proposed medication. The patient was given the opportunity to ask questions. Informed consent given to the use of the above medications. Will continue to adjust psychiatric and non-psychiatric medications (see above "medication" section and orders section for details) as deemed appropriate & based upon diagnoses and response to treatment.     Will continue to order blood tests/labs and diagnostic tests as deemed appropriate and review results as they become available (see orders for details and above listed lab/test results).    Will order psychiatric records from previous psych hospitals to further elucidate the nature of patient's psychopathology and review once available.    Will gather additional collateral information from  friends, family  and o/p treatment team to further elucidate the nature of patient's psychopathology and baselline level of psychiatric functioning.    Complete current electronic health record for patient has been reviewed today including consultant notes, ancillary staff notes, nurses and psychiatric tech notes    Will continue to provide individual, milieu, occupational, group, and substance abuse therapies to address target symptoms as deemed appropriate for the individual patient.      EXPECTED DISCHARGE DATE (Day): Monday to shelter    DISPOSITION: Local shelter  Psych FU with [facility] recommended.      Signed By: Iva Lento, MD  10/16/2013

## 2013-10-16 NOTE — Behavioral Health Treatment Team (Cosign Needed)
GROUP THERAPY PROGRESS NOTE    Troy Medina is participating in Le[employer]ive Group.     Group time: 1.5 hour    Personal goal for participation: to work on selected tasks    Goal orientation: social    Group therapy participation: active    Therapeutic interventions reviewed and discussed: yes    Impression of participation: cooperative

## 2013-10-16 NOTE — Behavioral Health Treatment Team (Cosign Needed)
GROUP THERAPY PROGRESS NOTE    Troy Medina is participating in Goals group.     Group time: 30 minutes    Personal goal for participation: to orient daily goal.    Goal orientation: personal    Group therapy participation: minimal    Therapeutic interventions reviewed and discussed: yes    Impression of participation: no problem

## 2013-10-16 NOTE — Behavioral Health Treatment Team (Signed)
The patient Troy Medina a 58 y.o. male has been visible on the unit. Less psychotic and agitated. No sign of hypo / hyperglycemia and blood glucose 102. Improved eye contact.  He denies suicidal and homicidal Ideation and verbally contracted for safety. Complaint with meals and medication. Patient denies audio / visual hallucination. Patient appears to be responding to internal stimuli. Affect is flat. Patient interact well with peers. Patient follows redirection. Patient has not had any angry outbursts on this shift. Patient has improved activity of daily living. Talked with patient and patient response was impressive but needs reinforcement. Will continue to monitor.

## 2013-10-16 NOTE — Progress Notes (Signed)
Problem: Altered Thought Process (Adult/Pediatric)  Goal: *STG: Seeks staff when feelings of anxiety and fear arise  Outcome: Progressing Towards Goal  PT observed at nurses station stating "I'm not going to get any sleep til I get some medication or something that will help" I reminded him that he had taken ambien 10 mg for the night. Pt stated perhaps a snack will help him to rest. I gave him unsweetened applesauce and crackers. He later returned to bed.

## 2013-10-16 NOTE — Behavioral Health Treatment Team (Signed)
Pt is visible on the unit.  Pt is meal and med compliant.  Pt attend groups.  Pt interacted appropriately with peers and staff.  Pt denies S/H ideations and A/V hallucinations at this time.  Pt remains on Q- 15 minute checks for safety.

## 2013-10-16 NOTE — Behavioral Health Treatment Team (Cosign Needed)
Pt is visible on the unit.  Pt is meal and med compliant.  Pt attend groups.  Pt interacted appropriately with peers and staff.  Pt denies S/H ideations and A/V hallucinations at this time.  Pt remains on Q- 15 minute checks for safety.

## 2013-10-17 LAB — GLUCOSE, POC
Glucose (POC): 102 mg/dL (ref 75–110)
Glucose (POC): 85 mg/dL (ref 75–110)
Glucose (POC): 87 mg/dL (ref 75–110)

## 2013-10-17 MED FILL — FUROSEMIDE 40 MG TAB: 40 mg | ORAL | Qty: 1

## 2013-10-17 MED FILL — FLUPHENAZINE 5 MG TAB: 5 mg | ORAL | Qty: 2

## 2013-10-17 MED FILL — ZOLPIDEM 5 MG TAB: 5 mg | ORAL | Qty: 2

## 2013-10-17 MED FILL — BENZTROPINE 1 MG TAB: 1 mg | ORAL | Qty: 1

## 2013-10-17 NOTE — Behavioral Health Treatment Team (Signed)
Patient rested in ed with eyes closed for the most part of the night. No complained or discomfort noted. Slept a total of 7 hours. Will continue to monitor.

## 2013-10-17 NOTE — Behavioral Health Treatment Team (Signed)
Psychiatric Progress Note      Date: 10/17/2013  Account Number:  [id number]  Name: Troy Medina      PSYCHOTHERAPY SESSION:  Length of psychotherapy session: 20 minutes  Main condition/diagnosis treated in session: compliace    Interpersonal relationship issues and psychodynamic conflicts explored.  Supportive psychotherapy provided in regards to various ongoing psychosocial stressors (including some of the following):   pre-admission and current problems   Housing issues   Occupational issues   Academic issues   Legal issues   Medical issues   Interpersonal conflicts   Stress of hospitalization              Worked on issues of denial & effects of substance dependency/use  Cognitive/Behavioral therapy provided  Reality-Oriented psychotherapy provided     Pt is not progressing    Extended energy and skill set needed to engage pt in psychotherapy due to the following: resistiveness of patient, complexity, resistance/negativity of patient, confrontational/hostile behaviors, and/or severe abnormalities in thought processes/psychosis resulting in the loss of expressive/receptive language communication skills.                                  E & M PROGRESS NOTE:         SUBJECTIVE:   CC: "I have problems with circular thinking"    HPI/Interval History: (reviewed/updated 10/17/2013)  The patient Troy Medina is a 58 y.o. Single, White male with a history of Schizophrenia, Paranoid type, homelessness, no medical or SA issues  who reports/evidences the following emotional symptoms:  agitation, psychotic behavior and psychosis.  The symptoms have been present for years and are of high severity. The symptoms are constant /intermittent in nature.  Additional symptoms include difficulty sleeping and increased irritability and precipitated by  and psychosocial stressors (homelesess ).  Condition made worse by  treatment noncompliance.UDS -, BAL=0.  Patient is reported to be doing better. He reports that he is thinking clearly  except some difficulty with circular thinking ? He was aware that his brother in [place] does not want him there. 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 Reports his cocaine abuse started after he was put on multiple medications for Schizophrenia, Dx in 1993. Reported that he has lost 20 + lbs in last 1 year.   Patient's work up for weight loss is negative. He is seen by Dietary and his meals are being adjusted for low albumin and weight loss. Agrees with d/c plan for Monday.    Pt continues to be focused on wanting more food - explained to pt that two Ensure supplements/daily were ordered yesterday in addition to his meals, he finds this unsatisfying, and became irritable at the suggestion that about five meals a day could be enough. Otherwise, plan remains unchanged.    Review of Systems:  (reviewed/updated 10/17/2013)  Appetite:good   Sleep: good   All other Review of Systems: - General ROS: positive for  - poor hygiene  Psychological ROS: positive for - irritability and sleep disturbances  Cardiovascular ROS: no chest pain or dyspnea on exertion    Side Effects: (reviewed/updated 10/17/2013)  None reported or admitted to.      Past Medical History: (reviewed/updated 10/17/2013)  Active Ambulatory Problems     Diagnosis Date Noted   ??? No Active Ambulatory Problems     Resolved Ambulatory Problems     Diagnosis Date  Noted   ??? No Resolved Ambulatory Problems     Past Medical History   Diagnosis Date   ??? Arthritis    ??? Diabetes    ??? Psychiatric disorder      Past medical history has been reviewed (see dictated report) with no additional updates (I asked patient and no additional past medical history provided).    Social History: (reviewed/updated 10/17/2013)   reports that he has been smoking.  He does not have any smokeless tobacco history on file. He reports that he does not drink alcohol or use illicit drugs.  History     Social History Narrative   ??? No narrative on file     Social  history has been reviewed (see dictated report) with no additional updates (I asked patient and no additional social history provided).    Family History: (reviewed/updated 10/17/2013)  Family history has been reviewed (see dictated report) with no additional updates (I asked patient and no additional family history provided).    OBJECTIVE:                 MENTAL STATUS EXAM:  (reviewed/updated 10/17/2013 with changes noted below)  FINDINGS WITHIN NORMAL LIMITS (WNL) UNLESS OTHERWISE STATED BELOW:    Orientation oriented to time, place and person   Vital Signs (BP,Pulse, Temp) See below (reviewed)   Gait and Station Within normal limits   Abnormal Muscular Movements/Tone/Behavior No EPS, no Tardive Dyskinesia, no abnormal muscular movements; wnl tone   Relations cooperative   General Appearance:  Appears older than stated age, poor hygiene    Language No aphasia or dysarthria   Speech:  normal pitch and normal volume   Thought Processes logical, wnl rate of thoughts, poor abstract reasoning and computation   Thought Associations More logical and linear.   Thought Content No gross delusions or hallucinations   Suicidal Ideations none   Homicidal Ideations none   Mood:  anxious   Affect:  blunted   Memory recent  adequate   Memory remote:  adequate   Concentration/Attention:  adequate   Fund of Knowledge Fair/average   Insight:  poor   Reliability poor   Judgment:  poor     Pertinent data/vitals:  Patient Vitals for the past 24 hrs:   Temp Pulse Resp BP   10/17/13 0705 96.6 ??F (35.9 ??C) 63 16 137/79 mmHg     Admission on 10/08/2013   Component Date Value Ref Range Status   ??? ALCOHOL(ETHYL),SERUM 10/08/2013 <10  <10 MG/DL Final   ??? Calcium, ionized (POC) 10/08/2013 1.17  1.12 - 1.32 MMOL/L Final   ??? Sodium (POC) 10/08/2013 131* 136 - 145 MMOL/L Final   ??? Potassium (POC) 10/08/2013 3.9  3.5 - 5.1 MMOL/L Final   ??? Chloride (POC) 10/08/2013 91* 98 - 107 MMOL/L Final   ??? CO2 (POC) 10/08/2013 27  21 - 32 MMOL/L Final   ??? Anion  gap (POC) 10/08/2013 18* 5 - 15 mmol/L Final   ??? Glucose (POC) 10/08/2013 113* 75 - 110 MG/DL Final   ??? BUN (POC) 10/08/2013 9  9 - 20 MG/DL Final   ??? Creatinine (POC) 10/08/2013 0.6  0.6 - 1.3 MG/DL Final   ??? GFR-AA (POC) 10/08/2013 >60  >60 ml/min/1.49m Final   ??? GFR, non-AA (POC) 10/08/2013 >60  >60 ml/min/1.718mFinal   ??? Hemoglobin (POC) 10/08/2013 12.2  12.1 - 17.0 GM/DL Final   ??? Hematocrit (POC) 10/08/2013 36* 36.6 - 50.3 % Final   ??? Comment  10/08/2013 Comment Not Indicated.   Final   ??? Glucose (POC) 10/10/2013 193* 75 - 110 mg/dL Final   ??? Performed by 10/10/2013 Mealy Monique   Final   ??? Glucose (POC) 10/10/2013 126* 75 - 110 mg/dL Final   ??? Performed by 10/10/2013 Mealy Monique   Final   ??? Glucose (POC) 10/10/2013 89  75 - 110 mg/dL Final   ??? Performed by 10/10/2013 Janeal Holmes   Final   ??? Glucose (POC) 10/11/2013 111* 75 - 110 mg/dL Final   ??? Performed by 10/11/2013 Mealy Monique   Final   ??? Glucose (POC) 10/11/2013 151* 75 - 110 mg/dL Final   ??? Performed by 10/11/2013 [facility] Tajudeen   Final   ??? Glucose (POC) 10/11/2013 120* 75 - 110 mg/dL Final   ??? Performed by 10/11/2013 [facility] Tajudeen   Final   ??? Glucose (POC) 10/12/2013 85  75 - 110 mg/dL Final   ??? Performed by 10/12/2013 Horton Chin   Final   ??? Glucose (POC) 10/12/2013 80  75 - 110 mg/dL Final   ??? Performed by 10/12/2013 Horton Chin   Final   ??? Glucose (POC) 10/12/2013 89  75 - 110 mg/dL Final   ??? Performed by 10/12/2013 Mealy Monique   Final   ??? Glucose (POC) 10/12/2013 89  75 - 110 mg/dL Final   ??? Performed by 10/12/2013 Roebuck Corine   Final   ??? Glucose (POC) 10/13/2013 95  75 - 110 mg/dL Final   ??? Performed by 10/13/2013 Mealy Monique   Final   ??? Glucose (POC) 10/13/2013 87  75 - 110 mg/dL Final   ??? Performed by 10/13/2013 Mealy Monique   Final   ??? Glucose (POC) 10/13/2013 122* 75 - 110 mg/dL Final   ??? Performed by 10/13/2013 Dorette Grate   Final   ??? Glucose (POC) 10/13/2013 102  75 - 110 mg/dL Final   ??? Performed  by 10/13/2013 Levora Dredge A.   Final   ??? Hemoglobin A1c 10/14/2013 5.5  4.2 - 6.3 % Final   ??? Est. average glucose 10/14/2013 111   Final   ??? Sodium 10/14/2013 138  136 - 145 mmol/L Final   ??? Potassium 10/14/2013 4.0  3.5 - 5.1 mmol/L Final   ??? Chloride 10/14/2013 103  97 - 108 mmol/L Final   ??? CO2 10/14/2013 29  21 - 32 mmol/L Final   ??? Anion gap 10/14/2013 6  5 - 15 mmol/L Final   ??? Glucose 10/14/2013 85  65 - 100 mg/dL Final   ??? BUN 10/14/2013 14  6 - 20 MG/DL Final   ??? Creatinine 10/14/2013 0.51  0.45 - 1.15 MG/DL Final   ??? BUN/Creatinine ratio 10/14/2013 27* 12 - 20   Final   ??? GFR est AA 10/14/2013 >60  >60 ml/min/1.36m Final   ??? GFR est non-AA 10/14/2013 >60  >60 ml/min/1.731mFinal   ??? Calcium 10/14/2013 8.0* 8.5 - 10.1 MG/DL Final   ??? Bilirubin, total 10/14/2013 0.3  0.2 - 1.0 MG/DL Final   ??? ALT 10/14/2013 40  12 - 78 U/L Final   ??? AST 10/14/2013 25  15 - 37 U/L Final   ??? Alk. phosphatase 10/14/2013 99  45 - 117 U/L Final   ??? Protein, total 10/14/2013 5.3* 6.4 - 8.2 g/dL Final   ??? Albumin 10/14/2013 2.6* 3.5 - 5.0 g/dL Final   ??? Globulin 10/14/2013 2.7  2.0 - 4.0 g/dL Final   ??? A-G Ratio 10/14/2013  1.0* 1.1 - 2.2   Final   ??? Glucose (POC) 10/14/2013 81  75 - 110 mg/dL Final   ??? Performed by 10/14/2013 Dorette Grate   Final   ??? AMPHETAMINE 10/14/2013 NEGATIVE   NEG   Final   ??? BARBITURATES 10/14/2013 NEGATIVE   NEG   Final   ??? BENZODIAZEPINE 10/14/2013 NEGATIVE   NEG   Final   ??? COCAINE 10/14/2013 NEGATIVE   NEG   Final   ??? METHADONE 10/14/2013 NEGATIVE   NEG   Final   ??? OPIATES 10/14/2013 NEGATIVE   NEG   Final   ??? PCP(PHENCYCLIDINE) 10/14/2013 NEGATIVE   NEG   Final   ??? THC (TH-CANNABINOL) 10/14/2013 NEGATIVE   NEG   Final   ??? DRUG SCRN COMMENT 10/14/2013 (NOTE)   Final   ??? Glucose (POC) 10/14/2013 76  75 - 110 mg/dL Final   ??? Performed by 10/14/2013 Dorette Grate   Final   ??? Glucose (POC) 10/14/2013 130* 75 - 110 mg/dL Final   ??? Performed by 10/14/2013 Dorette Grate   Final   ???  Glucose (POC) 10/14/2013 108  75 - 110 mg/dL Final   ??? Performed by 10/14/2013 Wynetta Emery Essence   Final   ??? Glucose (POC) 10/14/2013 103  75 - 110 mg/dL Final   ??? Performed by 10/14/2013 Wynetta Emery Essence   Final   ??? Glucose (POC) 10/15/2013 100  75 - 110 mg/dL Final   ??? Performed by 10/15/2013 Mealy Monique   Final   ??? Glucose (POC) 10/15/2013 89  75 - 110 mg/dL Final   ??? Performed by 10/15/2013 Mealy Monique   Final   ??? Glucose (POC) 10/15/2013 105  75 - 110 mg/dL Final   ??? Performed by 10/15/2013 Denyse Dago   Final   ??? Glucose (POC) 10/15/2013 113* 75 - 110 mg/dL Final   ??? Performed by 10/15/2013 Denyse Dago   Final   ??? Glucose (POC) 10/16/2013 87  75 - 110 mg/dL Final   ??? Performed by 10/16/2013 Gwenlyn Found   Final   ??? Glucose (POC) 10/16/2013 84  75 - 110 mg/dL Final   ??? Performed by 10/16/2013 Gwenlyn Found   Final   ??? Glucose (POC) 10/16/2013 94  75 - 110 mg/dL Final   ??? Performed by 10/16/2013 Gwenlyn Found   Final   ??? Glucose (POC) 10/16/2013 102  75 - 110 mg/dL Final   ??? Performed by 10/16/2013 Gwenlyn Found   Final   ??? Glucose (POC) 10/17/2013 85  75 - 110 mg/dL Final   ??? Performed by 10/17/2013 Gwenlyn Found   Final   ??? Glucose (POC) 10/17/2013 87  75 - 110 mg/dL Final   ??? Performed by 10/17/2013 Gwenlyn Found   Final         XR Results (most recent):  No results found for this or any previous visit.    Allergies:  No Known Allergies    Medications:  Current Facility-Administered Medications   Medication Dose Route Frequency   ??? fluPHENAZine (PROLIXIN) tablet 10 mg  10 mg Oral QHS   ??? furosemide (LASIX) tablet 20 mg  20 mg Oral DAILY   ??? benztropine (COGENTIN) tablet 1 mg  1 mg Oral QHS   ??? insulin lispro (HUMALOG) injection   SubCUTAneous AC&HS   ??? glucose chewable tablet 16 g  4 Tab Oral PRN   ??? dextrose (D50W) injection syrg 12.5-25 g  12.5-25 g IntraVENous PRN   ??? glucagon (GLUCAGEN) injection 1 mg  1 mg IntraMUSCular PRN   ??? ziprasidone  (GEODON) 20 mg in sterile water (preservative free) injection  20 mg IntraMUSCular BID PRN   ??? OLANZapine (ZyPREXA) tablet 5 mg  5 mg Oral Q6H PRN   ??? benztropine (COGENTIN) tablet 2 mg  2 mg Oral BID PRN   ??? benztropine (COGENTIN) injection 2 mg  2 mg IntraMUSCular Q12H PRN   ??? LORazepam (ATIVAN) injection 2 mg  2 mg IntraMUSCular Q4H PRN   ??? LORazepam (ATIVAN) tablet 1 mg  1 mg Oral Q4H PRN   ??? zolpidem (AMBIEN) tablet 10 mg  10 mg Oral QHS PRN   ??? acetaminophen (TYLENOL) tablet 650 mg  650 mg Oral Q4H PRN   ??? ibuprofen (MOTRIN) tablet 400 mg  400 mg Oral Q8H PRN   ??? magnesium hydroxide (MILK OF MAGNESIA) oral suspension 30 mL  30 mL Oral DAILY PRN   ??? nicotine (NICODERM CQ) 21 mg/24 hr patch 1 patch  1 Patch TransDERmal DAILY PRN       Scheduled Medications:  Current Facility-Administered Medications   Medication Dose Route Frequency   ??? fluPHENAZine (PROLIXIN) tablet 10 mg  10 mg Oral QHS   ??? furosemide (LASIX) tablet 20 mg  20 mg Oral DAILY   ??? benztropine (COGENTIN) tablet 1 mg  1 mg Oral QHS   ??? insulin lispro (HUMALOG) injection   SubCUTAneous AC&HS         ASSESSMENT/PLAN:   Patient is still with a severe exacerbation of condition which is not improving  Diagnoses:(reviewed/updated 10/17/2013)  Patient Active Hospital Problem List:   Schizophrenia (10/08/2013)    Assessment: Much improved. Compliant. Improved insight and judgement.     Plan: Continue Prolixin 10 mg at bedtime. Continue cogentin  Weight loss: HbA1c: 5.5 UDS - negative,   Dietary consult.- done  Left leg swelling: LE Doppler - negative. No DVT    Current plan to discharge on Monday.          A coordinated, multidisplinary treatment team round was conducted with the patient; that includes the nurse, unit pharmcist, Catering manager all present.     The following regarding medications was addressed during rounds with patient:   the risks and benefits of the proposed medication. The patient was given the opportunity to ask questions.  Informed consent given to the use of the above medications. Will continue to adjust psychiatric and non-psychiatric medications (see above "medication" section and orders section for details) as deemed appropriate & based upon diagnoses and response to treatment.     Will continue to order blood tests/labs and diagnostic tests as deemed appropriate and review results as they become available (see orders for details and above listed lab/test results).    Will order psychiatric records from previous psych hospitals to further elucidate the nature of patient's psychopathology and review once available.    Will gather additional collateral information from  friends, family and o/p treatment team to further elucidate the nature of patient's psychopathology and baselline level of psychiatric functioning.    Complete current electronic health record for patient has been reviewed today including consultant notes, ancillary staff notes, nurses and psychiatric tech notes    Will continue to provide individual, milieu, occupational, group, and substance abuse therapies to address target symptoms as deemed appropriate for the individual patient.      EXPECTED DISCHARGE DATE (Day): Monday to shelter  DISPOSITION: Local shelter  Psych FU with [facility] recommended.      Signed By: Iva Lento, MD  10/17/2013

## 2013-10-17 NOTE — Behavioral Health Treatment Team (Cosign Needed)
At the present time,patient behavior has been appropriate,no acting out,impulsive or disruptive behavior noted.Did participate in group and was able to stay the entire time and asked appropriate questions.Denies suicidal,homicidal ideation,feelings of depression,verbalized about feeling better since his admission to the hospital.Complaint with med's,meals and patient remains on Q 15 minute checks for safety.

## 2013-10-17 NOTE — Behavioral Health Treatment Team (Signed)
Patient approached male peer in dayroom yelled and cursed at her unprovoked. Patient was disruptive in dayroom earlier part of shift needing redirection,limit setting and time-out in room. Irritable and easily agitated. Reviewed conduct rules with patient. Medicate as needed with prn medications. Will continue to monitor patient and behavior every 15 minutes.

## 2013-10-17 NOTE — Behavioral Health Treatment Team (Cosign Needed)
GROUP THERAPY PROGRESS NOTE    The patient Troy Medina a 58 y.o. male is participating in Reflections Group    Group time: 30 minutes    Personal goal for participation: To discuss the daily goals    Goal orientation: personal    Group therapy participation: passive    Therapeutic interventions reviewed and discussed:  Yes    Impression of participation: fair    Darrel HooverJohn E Robinson  10/17/2013  9:18 PM

## 2013-10-17 NOTE — Behavioral Health Treatment Team (Signed)
GROUP THERAPY PROGRESS NOTE    Troy Medina is participating in Goals Group.     Group time: 20 minutes    Personal goal for participation: Goal Setting    Goal orientation: personal    Group therapy participation: active    Therapeutic interventions reviewed and discussed: yes    Impression of participation: good

## 2013-10-17 NOTE — Behavioral Health Treatment Team (Cosign Needed)
GROUP THERAPY PROGRESS NOTE    Troy Medina is participating in Community.     Group time: 30 minutes    Personal goal for participation: daily orientation    Goal orientation: personal    Group therapy participation: active    Therapeutic interventions reviewed and discussed: yes    Impression of participation: cooperative

## 2013-10-18 LAB — GLUCOSE, POC
Glucose (POC): 93 mg/dL (ref 75–110)
Glucose (POC): 106 mg/dL (ref 75–110)

## 2013-10-18 MED ORDER — FLUPHENAZINE 10 MG TAB
10 mg | ORAL_TABLET | Freq: Every evening | ORAL | Status: AC
Start: 2013-10-18 — End: 2013-11-17

## 2013-10-18 MED ORDER — BENZTROPINE 1 MG TAB
1 mg | ORAL_TABLET | Freq: Every evening | ORAL | Status: AC
Start: 2013-10-18 — End: 2013-11-17

## 2013-10-18 MED FILL — ZOLPIDEM 5 MG TAB: 5 mg | ORAL | Qty: 2

## 2013-10-18 MED FILL — LORAZEPAM 1 MG TAB: 1 mg | ORAL | Qty: 1

## 2013-10-18 MED FILL — BENZTROPINE 1 MG TAB: 1 mg | ORAL | Qty: 1

## 2013-10-18 MED FILL — FLUPHENAZINE 5 MG TAB: 5 mg | ORAL | Qty: 2

## 2013-10-18 MED FILL — FUROSEMIDE 40 MG TAB: 40 mg | ORAL | Qty: 1

## 2013-10-18 NOTE — Behavioral Health Treatment Team (Signed)
Patient rested in bed with eyes closed for part of the night. No complains or discomfort noted. Slept a total of 5 hours. Will continue to monitor.

## 2013-10-18 NOTE — Behavioral Health Treatment Team (Cosign Needed)
GROUP THERAPY PROGRESS NOTE    Troy Medina is participating in [facility].     Group time: 30 minutes    Personal goal for participation: daily orientation    Goal orientation: personal    Group therapy participation: active    Therapeutic interventions reviewed and discussed: yes    Impression of participation: cooperative

## 2013-10-18 NOTE — Behavioral Health Treatment Team (Signed)
Art Therapy Group    Richardson DoppKenneth Bartow attended Art Therapy Group    Group Time: 45 minutes    Treatment Goal for Each Participant: Each Participant: Used creative means to state or describe one personal change necessary to improve mental health.    Participation    moderate    Engagement with Peers   none     Impression of Participation: Attended second half of group. [place] with good hygiene. Patient drew a bridge "from [place] in [place]", stated "I like bridges".  Reported that he "had to get his diabetes under control" during this hospital stay.

## 2013-10-18 NOTE — Behavioral Health Treatment Team (Signed)
Social Work     Met with pt in team for treatment and discharge planning    Pt appears to be at baseline with the same, appropriate mood and affect.  The pt exhibited no gross psychosis or mania and was discharged.  He was transported via [employer] on a ticket due to no other means of transportation, to [facility] for shelter.  He was referred to the [facility] for follow up.     Delight Stare, LCSW

## 2013-10-18 NOTE — Behavioral Health Treatment Team (Cosign Needed)
Continue to need assistance with housing,possible discharge soon,verbalized about social worker helping him with this issue.Hygiene has improved states he feels better,complaint with med's,meals,no sign of distrss or anxiety at the present time.Patient is receptive to follow up treatment after discharge and the importance of taking med's and not to stop because he feels better.Did participate in group and was able to stay the entire time.Remains on Q 15 minute checks for safety.

## 2013-10-18 NOTE — Behavioral Health Treatment Team (Signed)
Patient discharged this time, given prescriptions and discharge instructions. Also given back valuables and belongings. Discussed discharge instructions and stated understanding of the same. Denies feelings of wanting to harm himself and others. Denies hallucinations. Escorted off unit without difficulty

## 2013-10-18 NOTE — Behavioral Health Treatment Team (Signed)
Patient seen, staffing held and discharge summary dictated   Please see dictated report for full details. Patient stable for discharge and considered to be at low risk of harm to self or others.  Informed consent given to the use of discharge medications.  Treatment rounds held in full.  Spent greater than 35 minutes on discharge work.

## 2013-10-19 NOTE — ED Notes (Signed)
Pt given ensure after eating his breakfast.

## 2013-10-19 NOTE — ED Notes (Signed)
Pt sitting up in room eating breakfast tray

## 2013-10-19 NOTE — ED Notes (Signed)
[facility] and [facility]. I had my meds yesterday but I don't know if they are working. When asked why he thought his meds weren't working he replied 'I don't know. I have no where to live."

## 2013-10-19 NOTE — ED Notes (Signed)
Was at shelter and couldn't sleep so walked around all night on the street. [facility].

## 2013-10-19 NOTE — ED Provider Notes (Signed)
HPI Comments: 58 y.o. male with past medical history significant for schizophrenia and diabetes who presents from [facility] via EMS with chief complaint of severe insomnia.  Patient was discharged from [facility] yesterday and went to [facility].  Patient states he dropped his prescriptions off at [facility] yesterday afternoon and he doesn't know if they have been filled.  Per patient, the manager at [facility] called EMS this morning.  Patient previously lived in [place], [place] and recently took a bus to [place] where his mother and three brothers live, but decided to stop in [place].  Patient states he did not have any plans for where to live when he stopped in [place].  Patient reports staff at [facility] called his family and he was not welcome to stay with them, but he wants to continue on his way to [place] to be closer to them.  Patient complains of chronic insomnia and states he usually gets 2-5 hours of interrupted sleep during the night.  Patient states he couldn't sleep at the shelter last night so he walked around [place] "for 20 hours."  Patient states he feels as though he is not competent because he walks around the city for long periods of time.  Patient also complains of chronic diarrhea.  Patient denies visual/auditory hallucinations, HI, SI, previous suicide attempts, vomiting.  There are no other acute medical concerns at this time.  His psych meds don't help he says.    Social hx: Homeless.  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  Patient's ex-wife and children live in [place].  PCP: Phys Other, MD      Note written by Jake Church. Alben Spittle, Neurosurgeon, as dictated by Volanda Napoleon, DO 7:04 AM      Old chart review:  Patient appears to have been discharged from [facility] yesterday after being admitted 09/28/13 - discharge summary has not been transcribed yet.  Patient is from [place], [place] and was apparently diagnosed with schizophrenia at age 13.    The history is provided by the  patient and the EMS personnel.        Past Medical History   Diagnosis Date   ??? Arthritis    ??? Diabetes    ??? Psychiatric disorder      schizophrenia        Past Surgical History   Procedure Laterality Date   ??? Hx orthopaedic       back/lower/lumbar surgery         No family history on file.     History     Social History   ??? Marital Status: UNKNOWN     Spouse Name: N/A     Number of Children: N/A   ??? Years of Education: N/A     Occupational History   ??? Not on file.     Social History Main Topics   ??? Smoking status: Current Every Day Smoker -- 1.00 packs/day   ??? Smokeless tobacco: Not on file   ??? Alcohol Use: No   ??? Drug Use: No   ??? Sexual Activity: Not on file     Other Topics Concern   ??? Not on file     Social History Narrative   ??? No narrative on file                  ALLERGIES: Review of patient's allergies indicates no known allergies.      Review of Systems  Gastrointestinal: Positive for diarrhea. Negative for vomiting.   Psychiatric/Behavioral: Positive for sleep disturbance. Negative for suicidal ideas, hallucinations and self-injury.   All other systems reviewed and are negative.      Filed Vitals:    10/19/13 0651   BP: 148/87   Pulse: 73   Temp: 97.6 ??F (36.4 ??C)   Resp: 16   Height: 6\' 2"  (1.88 m)   Weight: 81.647 kg (180 lb)   SpO2: 100%            Physical Exam   Constitutional: He appears well-developed and well-nourished. No distress.   Disheveled appearance   HENT:   Head: Normocephalic and atraumatic.   Right Ear: External ear normal.   Left Ear: External ear normal.   Nose: Nose normal.   Mouth/Throat: Mucous membranes are dry.   Eyes: Conjunctivae are normal. Pupils are equal, round, and reactive to light. Right eye exhibits no discharge. Left eye exhibits no discharge.   Neck: Normal range of motion. Neck supple.   Cardiovascular: Normal rate, regular rhythm and normal heart sounds.    No murmur heard.  Pulmonary/Chest: Effort normal and breath sounds normal.   Abdominal: Soft. He exhibits no  distension. There is no tenderness.   Genitourinary:   Incontinent of urine.   Musculoskeletal: Normal range of motion. He exhibits no edema or tenderness.   Neurological: He is alert.   Awake, alert, disheveled.   Skin: Skin is warm and dry. No rash noted. He is not diaphoretic. No erythema.   Psychiatric: He is not actively hallucinating. He expresses no homicidal and no suicidal ideation.   Flat affect.  No external stimulations.   Nursing note and vitals reviewed.  Note written by Leotis ShamesLauren A. Alben SpittleWeaver, Neurosurgeoncribe, as dictated by Volanda NapoleonJeffrey S Alexica Schlossberg, DO 7:09 AM       MDM    Procedures      Stable here.  No need for readmission or BSMART eval.  Pt is agreeable to DC.  [facility] later today.

## 2013-10-19 NOTE — ED Notes (Signed)
Pt left ambulatory with discharge instructions and bus tickets.

## 2013-10-19 NOTE — ED Notes (Signed)
[facility] but does not open until 0830.

## 2013-10-21 NOTE — Behavioral Health Treatment Team (Signed)
Social Work    [facility] faxed to the [facility].

## 2013-10-28 NOTE — Discharge Summary (Signed)
Name:       Troy Medina, Troy Medina                  Admitted:    10/08/2013                                               Discharged:  10/18/2013  Account #:  [id number]                     DOB:         13-Jul-1956  Consultant: Adela LankSultan A Marquin Patino, MD             Age          58                                 DISCHARGE SUMMARY      DISCHARGE DIAGNOSES  AXIS I: Schizophrenia, paranoid type.  AXIS II: None.  AXIS III: Arthritis and diabetes mellitus.  AXIS IV: Mild to moderate on discharge.  AXIS V: 45-50 on discharge.    HISTORY OF PRESENT ILLNESS: Please see initial psychiatric evaluation in  the chart by Dr. Andrey Campanileamesh Chaudry dated on 10/10/2013, for full details, as  well as for description of mental status examination at the time of  presentation.    HOSPITALIZATION COURSE: The patient is a 58 year old white male with a  history of schizophrenia, paranoid type, who was admitted on a voluntary  basis for worsening psychosis and inability to care for self. The patient  has not been taking his medications. He was arrived from [place]. Dr. Rachael Darbyhaudry started the patient back on his Prolixin, benztropine  and Lispro insulin.    I took over the care on 10/11/2013. The patient was seen as disheveled with  poor hygiene. He was irritable and paranoid. The patient remained isolative.Marland Kitchen. He reported  that he wants to go back to [place], and has been in [place], for the last 6 days only. He reported that he has SSDI. 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 He required p.r.n. Zyprexa for agitation. His medications  were increased. By 10/13/2013, the patient was starting to feel better. He  was compliant with medication. He continued to go back and forth about  where he is going to go. Initially, he was planning to go back to [place], and then he said he is going to go and live with his family  members in [place]. His brother in [place] apparently refused to take  him in when Child psychotherapistsocial worker called. The patient had some left leg swelling.  DVT was ruled out by doing venous Doppler. The patient stayed compliant  with his medication, and started to get more logical and linear. On the day  of discharge, the patient was free of suicidal or homicidal ideation. The  patient was sleeping and eating well. He contracted for safety outside of  the hospital. The patient was stable for discharge, and considered to be at  low risk of harm to self or others.    DISCHARGE MEDICATIONS  1. Prolixin 10 mg at bedtime.  2. Lasix 20 mg daily.  3. Benztropine 1 mg  at bedtime.  4. Lispro insulin as directed.    DISPOSITION: The [facility] was [facility]. The patient will follow up with [facility] for his psychiatric  needs.    PROGNOSIS: Poor.              Adela Lank, MD    cc:    Adela Lank, MD      SAL/wmx; D: 10/28/2013 11:12 A; T: 10/28/2013 11:38 A; DOC# [id number]; Job#  [id number]
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
## 2014-10-28 ENCOUNTER — Telehealth (HOSPITAL_COMMUNITY): Payer: Self-pay | Admitting: *Deleted

## 2014-10-28 NOTE — Telephone Encounter (Signed)
Received disability claim form for patient.  Claim Number:[id number]. Number to [place] with Becky. Jacqlyn Larsen stated all disability reps have left will be back Monday morning.  Jacqlyn Larsen stated someone will call between 8 am- 10 am. I advised Becky to please note that patient has not been seen here since 2014 and no future appointments.  I advised Shawn just in case they call Monday before I arrive.

## 2014-10-31 NOTE — Telephone Encounter (Signed)
Representative from [employer] called and left a message in regards to requested medical records from patient's past.  Records will be sent from 2012 until patient was no longer open in 2014.  Questionnaire will not be completed as Jimmye Norman, PA-C is no longer at this practice.
